# Patient Record
Sex: Female | Born: 1955
Health system: Southern US, Community
[De-identification: ages and names within clinical notes are randomized; demographics above are authoritative.]

## PROBLEM LIST (undated history)

## (undated) DIAGNOSIS — Z8742 Personal history of other diseases of the female genital tract: Secondary | ICD-10-CM

## (undated) DIAGNOSIS — N393 Stress incontinence (female) (male): Secondary | ICD-10-CM

## (undated) DIAGNOSIS — IMO0002 Reserved for concepts with insufficient information to code with codable children: Secondary | ICD-10-CM

## (undated) DIAGNOSIS — I10 Essential (primary) hypertension: Secondary | ICD-10-CM

## (undated) DIAGNOSIS — R9431 Abnormal electrocardiogram [ECG] [EKG]: Secondary | ICD-10-CM

## (undated) DIAGNOSIS — N950 Postmenopausal bleeding: Secondary | ICD-10-CM

## (undated) DIAGNOSIS — N840 Polyp of corpus uteri: Secondary | ICD-10-CM

## (undated) DIAGNOSIS — R32 Unspecified urinary incontinence: Secondary | ICD-10-CM

## (undated) DIAGNOSIS — G4730 Sleep apnea, unspecified: Secondary | ICD-10-CM

## (undated) DIAGNOSIS — Z8719 Personal history of other diseases of the digestive system: Secondary | ICD-10-CM

## (undated) DIAGNOSIS — B379 Candidiasis, unspecified: Secondary | ICD-10-CM

## (undated) DIAGNOSIS — M545 Low back pain, unspecified: Secondary | ICD-10-CM

## (undated) DIAGNOSIS — M8580 Other specified disorders of bone density and structure, unspecified site: Secondary | ICD-10-CM

## (undated) DIAGNOSIS — D219 Benign neoplasm of connective and other soft tissue, unspecified: Secondary | ICD-10-CM

## (undated) DIAGNOSIS — E785 Hyperlipidemia, unspecified: Secondary | ICD-10-CM

## (undated) HISTORY — DX: Essential (primary) hypertension: I10

## (undated) HISTORY — DX: Hyperlipidemia, unspecified: E78.5

## (undated) HISTORY — DX: Postmenopausal bleeding: N95.0

## (undated) HISTORY — DX: Unspecified urinary incontinence: R32

## (undated) HISTORY — DX: Personal history of other diseases of the female genital tract: Z87.42

## (undated) HISTORY — DX: Stress incontinence (female) (male): N39.3

## (undated) HISTORY — DX: Benign neoplasm of connective and other soft tissue, unspecified: D21.9

## (undated) HISTORY — DX: Polyp of corpus uteri: N84.0

## (undated) HISTORY — DX: Sleep apnea, unspecified: G47.30

## (undated) HISTORY — DX: Personal history of other diseases of the digestive system: Z87.19

## (undated) HISTORY — DX: Reserved for concepts with insufficient information to code with codable children: IMO0002

## (undated) HISTORY — DX: Candidiasis, unspecified: B37.9

## (undated) HISTORY — DX: Abnormal electrocardiogram (ECG) (EKG): R94.31

## (undated) HISTORY — DX: Other specified disorders of bone density and structure, unspecified site: M85.80

---

## 1977-06-21 HISTORY — PX: OVARIAN CYST REMOVAL: SHX89

## 1979-06-22 DIAGNOSIS — IMO0002 Reserved for concepts with insufficient information to code with codable children: Secondary | ICD-10-CM

## 1979-06-22 HISTORY — DX: Reserved for concepts with insufficient information to code with codable children: IMO0002

## 1984-06-21 DIAGNOSIS — Z8719 Personal history of other diseases of the digestive system: Secondary | ICD-10-CM

## 1984-06-21 HISTORY — DX: Personal history of other diseases of the digestive system: Z87.19

## 1993-06-21 HISTORY — PX: OTHER SURGICAL HISTORY: SHX169

## 1995-06-22 DIAGNOSIS — B379 Candidiasis, unspecified: Secondary | ICD-10-CM

## 1995-06-22 HISTORY — DX: Candidiasis, unspecified: B37.9

## 1996-06-21 DIAGNOSIS — R32 Unspecified urinary incontinence: Secondary | ICD-10-CM

## 1996-06-21 HISTORY — DX: Unspecified urinary incontinence: R32

## 1998-06-21 HISTORY — PX: FOOT NEUROMA SURGERY: SHX646

## 1999-01-09 ENCOUNTER — Ambulatory Visit (HOSPITAL_COMMUNITY): Admission: RE | Admit: 1999-01-09 | Discharge: 1999-01-09 | Payer: Self-pay | Admitting: Internal Medicine

## 1999-01-09 ENCOUNTER — Encounter (INDEPENDENT_AMBULATORY_CARE_PROVIDER_SITE_OTHER): Payer: Self-pay

## 1999-01-09 ENCOUNTER — Encounter: Payer: Self-pay | Admitting: Internal Medicine

## 1999-07-14 ENCOUNTER — Ambulatory Visit (HOSPITAL_COMMUNITY): Admission: RE | Admit: 1999-07-14 | Discharge: 1999-07-14 | Payer: Self-pay | Admitting: Internal Medicine

## 1999-07-14 ENCOUNTER — Encounter: Payer: Self-pay | Admitting: Internal Medicine

## 2000-01-04 ENCOUNTER — Encounter: Admission: RE | Admit: 2000-01-04 | Discharge: 2000-01-04 | Payer: Self-pay | Admitting: Internal Medicine

## 2000-01-04 ENCOUNTER — Encounter: Payer: Self-pay | Admitting: Internal Medicine

## 2000-10-23 ENCOUNTER — Ambulatory Visit (HOSPITAL_BASED_OUTPATIENT_CLINIC_OR_DEPARTMENT_OTHER): Admission: RE | Admit: 2000-10-23 | Discharge: 2000-10-23 | Payer: Self-pay | Admitting: Family Medicine

## 2001-01-18 ENCOUNTER — Encounter: Payer: Self-pay | Admitting: Family Medicine

## 2001-01-18 ENCOUNTER — Encounter: Admission: RE | Admit: 2001-01-18 | Discharge: 2001-01-18 | Payer: Self-pay | Admitting: Family Medicine

## 2001-06-20 ENCOUNTER — Other Ambulatory Visit: Admission: RE | Admit: 2001-06-20 | Discharge: 2001-06-20 | Payer: Self-pay | Admitting: Obstetrics and Gynecology

## 2001-06-21 DIAGNOSIS — Z8742 Personal history of other diseases of the female genital tract: Secondary | ICD-10-CM

## 2001-06-21 DIAGNOSIS — N393 Stress incontinence (female) (male): Secondary | ICD-10-CM

## 2001-06-21 HISTORY — DX: Personal history of other diseases of the female genital tract: Z87.42

## 2001-06-21 HISTORY — DX: Stress incontinence (female) (male): N39.3

## 2001-06-30 ENCOUNTER — Encounter: Payer: Self-pay | Admitting: Obstetrics and Gynecology

## 2001-06-30 ENCOUNTER — Ambulatory Visit (HOSPITAL_COMMUNITY): Admission: RE | Admit: 2001-06-30 | Discharge: 2001-06-30 | Payer: Self-pay | Admitting: Obstetrics and Gynecology

## 2002-01-22 ENCOUNTER — Encounter: Admission: RE | Admit: 2002-01-22 | Discharge: 2002-01-22 | Payer: Self-pay | Admitting: Obstetrics and Gynecology

## 2002-01-22 ENCOUNTER — Encounter: Payer: Self-pay | Admitting: Obstetrics and Gynecology

## 2002-06-27 ENCOUNTER — Other Ambulatory Visit: Admission: RE | Admit: 2002-06-27 | Discharge: 2002-06-27 | Payer: Self-pay | Admitting: Obstetrics and Gynecology

## 2003-01-24 ENCOUNTER — Encounter: Payer: Self-pay | Admitting: Obstetrics and Gynecology

## 2003-01-24 ENCOUNTER — Encounter: Admission: RE | Admit: 2003-01-24 | Discharge: 2003-01-24 | Payer: Self-pay | Admitting: Obstetrics and Gynecology

## 2003-07-17 ENCOUNTER — Other Ambulatory Visit: Admission: RE | Admit: 2003-07-17 | Discharge: 2003-07-17 | Payer: Self-pay | Admitting: Obstetrics and Gynecology

## 2003-09-09 ENCOUNTER — Ambulatory Visit (HOSPITAL_COMMUNITY): Admission: RE | Admit: 2003-09-09 | Discharge: 2003-09-09 | Payer: Self-pay | Admitting: Obstetrics and Gynecology

## 2004-02-14 ENCOUNTER — Ambulatory Visit (HOSPITAL_COMMUNITY): Admission: RE | Admit: 2004-02-14 | Discharge: 2004-02-14 | Payer: Self-pay | Admitting: Obstetrics and Gynecology

## 2004-08-05 ENCOUNTER — Other Ambulatory Visit: Admission: RE | Admit: 2004-08-05 | Discharge: 2004-08-05 | Payer: Self-pay | Admitting: Obstetrics and Gynecology

## 2005-02-15 ENCOUNTER — Ambulatory Visit (HOSPITAL_COMMUNITY): Admission: RE | Admit: 2005-02-15 | Discharge: 2005-02-15 | Payer: Self-pay | Admitting: Obstetrics and Gynecology

## 2005-06-24 ENCOUNTER — Encounter: Admission: RE | Admit: 2005-06-24 | Discharge: 2005-06-24 | Payer: Self-pay | Admitting: Family Medicine

## 2005-08-10 ENCOUNTER — Other Ambulatory Visit: Admission: RE | Admit: 2005-08-10 | Discharge: 2005-08-10 | Payer: Self-pay | Admitting: Obstetrics and Gynecology

## 2006-02-16 ENCOUNTER — Ambulatory Visit (HOSPITAL_COMMUNITY): Admission: RE | Admit: 2006-02-16 | Discharge: 2006-02-16 | Payer: Self-pay | Admitting: Obstetrics and Gynecology

## 2006-08-19 ENCOUNTER — Encounter: Admission: RE | Admit: 2006-08-19 | Discharge: 2006-08-19 | Payer: Self-pay | Admitting: Family Medicine

## 2006-09-07 ENCOUNTER — Encounter: Admission: RE | Admit: 2006-09-07 | Discharge: 2006-12-06 | Payer: Self-pay | Admitting: Family Medicine

## 2006-12-30 ENCOUNTER — Ambulatory Visit (HOSPITAL_COMMUNITY): Admission: RE | Admit: 2006-12-30 | Discharge: 2006-12-30 | Payer: Self-pay | Admitting: Gastroenterology

## 2006-12-30 ENCOUNTER — Encounter (INDEPENDENT_AMBULATORY_CARE_PROVIDER_SITE_OTHER): Payer: Self-pay | Admitting: Gastroenterology

## 2007-01-23 ENCOUNTER — Encounter: Admission: RE | Admit: 2007-01-23 | Discharge: 2007-01-23 | Payer: Self-pay | Admitting: Family Medicine

## 2007-01-24 ENCOUNTER — Ambulatory Visit (HOSPITAL_COMMUNITY): Admission: RE | Admit: 2007-01-24 | Discharge: 2007-01-24 | Payer: Self-pay | Admitting: Gastroenterology

## 2007-01-27 ENCOUNTER — Ambulatory Visit (HOSPITAL_COMMUNITY): Admission: RE | Admit: 2007-01-27 | Discharge: 2007-01-27 | Payer: Self-pay | Admitting: Gastroenterology

## 2007-02-21 ENCOUNTER — Ambulatory Visit (HOSPITAL_COMMUNITY): Admission: RE | Admit: 2007-02-21 | Discharge: 2007-02-21 | Payer: Self-pay | Admitting: Obstetrics and Gynecology

## 2007-06-22 DIAGNOSIS — N840 Polyp of corpus uteri: Secondary | ICD-10-CM

## 2007-06-22 DIAGNOSIS — N950 Postmenopausal bleeding: Secondary | ICD-10-CM

## 2007-06-22 HISTORY — PX: COMBINED HYSTEROSCOPY DIAGNOSTIC / D&C: SUR297

## 2007-06-22 HISTORY — DX: Postmenopausal bleeding: N95.0

## 2007-06-22 HISTORY — DX: Polyp of corpus uteri: N84.0

## 2007-07-25 ENCOUNTER — Encounter: Admission: RE | Admit: 2007-07-25 | Discharge: 2007-07-25 | Payer: Self-pay | Admitting: Obstetrics and Gynecology

## 2007-08-31 ENCOUNTER — Encounter (INDEPENDENT_AMBULATORY_CARE_PROVIDER_SITE_OTHER): Payer: Self-pay | Admitting: Obstetrics and Gynecology

## 2007-08-31 ENCOUNTER — Ambulatory Visit (HOSPITAL_COMMUNITY): Admission: RE | Admit: 2007-08-31 | Discharge: 2007-08-31 | Payer: Self-pay | Admitting: Obstetrics and Gynecology

## 2008-02-22 ENCOUNTER — Ambulatory Visit (HOSPITAL_COMMUNITY): Admission: RE | Admit: 2008-02-22 | Discharge: 2008-02-22 | Payer: Self-pay | Admitting: Obstetrics and Gynecology

## 2008-05-21 ENCOUNTER — Encounter: Admission: RE | Admit: 2008-05-21 | Discharge: 2008-05-21 | Payer: Self-pay | Admitting: Family Medicine

## 2009-03-03 ENCOUNTER — Ambulatory Visit (HOSPITAL_COMMUNITY): Admission: RE | Admit: 2009-03-03 | Discharge: 2009-03-03 | Payer: Self-pay | Admitting: Obstetrics and Gynecology

## 2010-03-23 ENCOUNTER — Ambulatory Visit (HOSPITAL_COMMUNITY): Admission: RE | Admit: 2010-03-23 | Discharge: 2010-03-23 | Payer: Self-pay | Admitting: Obstetrics and Gynecology

## 2010-07-12 ENCOUNTER — Encounter: Payer: Self-pay | Admitting: Family Medicine

## 2010-11-03 NOTE — H&P (Signed)
NAME:  TAWANDA, SCHALL           ACCOUNT NO.:  [id number]   MEDICAL RECORD NO.:  [id number]          PATIENT TYPE:  AMB   LOCATION:  SDC                           FACILITY:  [facility]   PHYSICIAN:  Hal Morales, M.D.DATE OF BIRTH:  [birth year]   DATE OF ADMISSION:  DATE OF DISCHARGE:                              HISTORY & PHYSICAL   HISTORY OF PRESENT ILLNESS:  The patient is a 55 year old black married  female para 2-0-0-2 who presents for evaluation and management of  postmenopausal bleeding.  The patient underwent menopause in 2006 and  had absolutely no vaginal bleeding since that time until February 2009  at which time she had bleeding that she noted with wiping.  She had a  Pap smear undertaken at that time which was   DICTATION ENDED AT THIS POINT      Hal Morales, M.D.  Electronically Signed     VPH/MEDQ  D:  08/30/2007  T:  08/30/2007  Job:  [id number]

## 2010-11-03 NOTE — H&P (Signed)
[name], [name]           ACCOUNT NO.:  [id number]   MEDICAL RECORD NO.:  [id number]          PATIENT TYPE:  AMB   LOCATION:  SDC                           FACILITY:  [facility]   PHYSICIAN:  Hal Morales, M.D.DATE OF BIRTH:  1956-03-13   DATE OF ADMISSION:  08/31/2007  DATE OF DISCHARGE:                              HISTORY & PHYSICAL   HISTORY OF PRESENT ILLNESS:  The patient is a 55 year old black married  female para 2-0-0-2 who presents for evaluation and management of  postmenopausal bleeding.  The patient underwent menopause in 2006 and  had no further menstrual bleeding until February 2009 at which time she  began to note vaginal bleeding with wiping.  She was evaluated with a  Pap smear which was normal and a sonohysterogram which showed uterine  fibroids with the largest measuring 4.46 cm and none were submucosal.  There were two hyperechoic masses consistent with polyps, the largest of  which was 1.8 cm.  She presents for hysteroscopy, D&C and polyp removal.   PAST MEDICAL HISTORY:   MEDICAL ILLNESSES:  The patient was diagnosed with ulcerative colitis in  1986 but has required no therapy since 1990.  She was more recently  diagnosed with hypertension and diabetes which are managed in  conjunction with Dr. Renaye Rakers and said to be well-controlled.   GYN HISTORY:  The patient had a left ovarian cystectomy for removal of a  teratoma in 1995.  She had an abnormal Pap smear 1981 which was normal  on repeat and has been normal since that time.   OBSTETRICAL HISTORY:  She had spontaneous vaginal deliveries in 1986 and  1990 without difficulty.   FAMILY HISTORY:  Is positive for diabetes, hypertension and ovarian  cancer.   CURRENT MEDICATIONS:  Crestor, Diovan,  Januvia, hydrocodone p.r.n.  muscle spasms, calcium with vitamin D and multivitamin.   DRUG ALLERGIES:  SULFA, BEES WAX, ASPIRIN, LIALDA.   REVIEW OF SYSTEMS:  Is negative except as mentioned above.   The patient  specifically denies any abdominal pain, nausea, vomiting, constipation,  diarrhea or rectal bleeding.   PHYSICAL EXAMINATION:  The patient is an obese black female in no acute  distress, temperature 98.6, pulse 56, blood pressure is 130/86.  Weight  is 224 pounds.  Height is 5 feet 6 inches.  LUNGS: Clear.  HEART: Regular rate and rhythm.  ABDOMEN is obese without masses or organomegaly.  PELVIC:  EG/BUS within normal limits.  The vagina is atrophic.  The  cervix is nontender with no lesions.  The uterus is 8-10-week size and  irregular, but nontender.  Adnexa no masses.  Rectovaginal no masses.   IMPRESSION:  1. Postmenopausal bleeding.  2. Probable endometrial polyp.  3. Chronic hypertension.  4. Diabetes mellitus.  5. Obesity.  6. Stable ulcerative colitis   DISPOSITION:  Recommendation was made for hysteroscopy, D&C and removal  of polyp.  This will be performed on August 31, 2007 at [facility].  The risks of anesthesia, bleeding, infection, damage to adjacent organs  and uterine perforation have been explained to the patient, she seems to  understand and wishes to proceed.      Hal Morales, M.D.  Electronically Signed     VPH/MEDQ  D:  08/30/2007  T:  08/30/2007  Job:  [id number]

## 2010-11-03 NOTE — Op Note (Signed)
NAMECURTINA, Amber Adams           ACCOUNT NO.:  [id number]   MEDICAL RECORD NO.:  [id number]          PATIENT TYPE:  AMB   LOCATION:  ENDO                         FACILITY:  [facility]   PHYSICIAN:  Anselmo Rod, M.D.  DATE OF BIRTH:  1956-04-26   DATE OF PROCEDURE:  12/30/2006  DATE OF DISCHARGE:                               OPERATIVE REPORT   PROCEDURE PERFORMED:  Esophagogastroduodenoscopy with small bowel  biopsies.   ENDOSCOPIST:  Anselmo Rod, M.D.   INSTRUMENT USED:  Pentax video panendoscope.   INDICATIONS FOR PROCEDURE:  A 55 year old [place] American female  undergoing EGD for dysphagia and reflux, to rule out esophagitis,  gastritis, stricture, etc.   PREPROCEDURE PREPARATION:  Informed consent was procured from the  patient. The patient had fasted for 8 hours prior to the procedure. The  risks and benefits of the procedure were discussed with the patient in  great detail.   PREPROCEDURE PHYSICAL:  The patient had stable vital signs.  NECK:  Supple.  CHEST:  Clear to auscultation.  S1, S2 regular.  ABDOMEN:  Soft with normal bowel sounds.   DESCRIPTION OF PROCEDURE:  The patient was placed in the left lateral  decubitus position and sedated with 75 mcg of Fentanyl and 7.5 mg of  Versed given intravenously in slow incremental doses. Once the patient  was adequately sedate and maintained on low-flow oxygen and continuous  cardiac monitoring, the Pentax video panendoscope was advanced through  the mouthpiece, over the tongue, into the esophagus under direct vision.  The entire esophagus was widely patent with no evidence of ring,  stricture, masses, esophagitis or Barrett's mucosa. The scope was then  advanced into the stomach. Mild antral gastritis was noted. No masses or  polyps were seen. There was no evidence of ulcer disease.  Retroflexion  in the high cardia revealed no abnormalities.  There was no evidence of  a hiatal hernia.  The proximal small bowel  seemed somewhat abnormal in  the fact that the folds were somewhat scalloped; biopsies were done to  rule out presence of sprue, and there ws no obstruction. The patient  tolerated the procedure well without immediate complications.   IMPRESSION:  1. Normal-appearing esophagus. No stricture or Barrett's mucosa noted.  2. Mild antral gastritis; otherwise normal-appearing stomach.  3. Scalloped folds in the proximal small bowel, biopsies done to rule      out sprue.   RECOMMENDATIONS:  1. Await pathology results.  2. Avoid all nonsteroidals including aspirin for the next 4 weeks.  3. Proceed with a colonoscopy at this time.  4. Further recommendation to be made in follow-up.      Anselmo Rod, M.D.  Electronically Signed     JNM/MEDQ  D:  12/30/2006  T:  12/31/2006  Job:  [id number]   cc:   Hal Morales, M.D.  Fax: [phone]   Renaye Rakers, M.D.  Fax: [phone]

## 2010-11-03 NOTE — Op Note (Signed)
NAMEMANALI, MCELMURRY           ACCOUNT NO.:  [id number]   MEDICAL RECORD NO.:  [id number]          PATIENT TYPE:  AMB   LOCATION:  SDC                           [facility]ACILITY:  [facility]   PHYSICIAN:  Hal Morales, M.D.DATE OF BIRTH:  [birth year]   DATE OF PROCEDURE:  08/31/2007  DATE OF DISCHARGE:                               OPERATIVE REPORT   PREOPERATIVE DIAGNOSIS:  Post menopausal bleeding, endometrial polyps.   POSTOPERATIVE DIAGNOSES:  Post menopausal bleeding, endometrial polyps.   OPERATION:  Hysteroscopy, dilatation and curettage, polyp resection.   SURGEON:  Hal Morales, M.D.   ANESTHESIA:  General.   ESTIMATED BLOOD LOSS:  Less than 10 mL.   COMPLICATIONS:  None.   FINDINGS:  The uterine cavity contained two polypoid lesions, one on the  anterior fundal portion of the uterus with a broad flat base, the second  on the right lateral sidewall measuring approximately 3 mm and more  pedunculated.   PROCEDURE:  The patient was taken to the operating room after  appropriate identification and placed on the operating table.  After the  attainment of adequate general anesthesia, she was placed in the  lithotomy position.  The perineum and vagina were prepped with multiple  layers of Betadine and a red Robinson catheter used to empty the  bladder.  The perineum was draped as a sterile field.  A Graves speculum  was placed in the vagina and a paracervical block achieved with a total  of 10 mL of 2% Xylocaine in the 5 and 7 o'clock positions.  The cervix  was grasped with a tenaculum and sounded to 8 cm.  The cervix was then  dilated to accommodate the diagnostic hysteroscope which was used to  observe and document the above noted findings.  The resectoscope was  then used to sharply resect the anterior polypoid lesion as well as the  lateral polypoid lesion.  These were removed from the operative field  and the endometrial cavity was curetted in all four quadrants.  Specimens to pathology were endometrial polyps and endometrial  curettings.  All instruments were then removed from the vagina and the  patient was awakened from general anesthesia and taken to the recovery  room in satisfactory condition having tolerated the procedure well with  sponge and instrument counts correct.  The fluid deficit during the  entire procedure was 430 mL, though some of it was noted to be on the  floor and not captured by the automated fluid counter.   Discharge instructions are printed instructions for [facility] from [facility].  Discharge medications are ibuprofen 600 mg p.o. q.6h. p.r.n.  pain using over-the-counter medications.  The patient will have a follow  up appointment in two weeks.      Hal Morales, M.D.  Electronically Signed     VPH/MEDQ  D:  08/31/2007  T:  09/01/2007  Job:  [id number]

## 2010-11-03 NOTE — Op Note (Signed)
Amber Adams, Amber Adams           ACCOUNT NO.:  [id number]   MEDICAL RECORD NO.:  [id number]          PATIENT TYPE:  AMB   LOCATION:  ENDO                         FACILITY:  [facility]   PHYSICIAN:  Anselmo Rod, M.D.  DATE OF BIRTH:  Apr 17, 1956   DATE OF PROCEDURE:  12/30/2006  DATE OF DISCHARGE:                               OPERATIVE REPORT   PROCEDURE PERFORMED:  Colonoscopy with snare polypectomy x 6 and  multiple cold biopsies.   ENDOSCOPIST:  Anselmo Rod, M.D.   INSTRUMENT USED:  Pentax video colonoscope.   INDICATIONS FOR PROCEDURE:  A 55 year old [place] American female with a  questionable history of UC, undergoing a colonoscopy to rule out colonic  polyps, masses, etc.  Biopsies will be done for surveillance if needed.   PREPROCEDURE PREPARATION:  Informed consent was procured from the  patient. The patient was fasted for 4 hours prior to the procedure and  prepped with MoviPrep the night of and the morning of the procedure.  Risks and benefits of the procedure including a 10% miss rate of cancer  and polyp were discussed with the patient as well.   PREPROCEDURE PHYSICAL:  The patient had stable vital signs.  NECK:  Supple.  CHEST:  The to auscultation.  S1, S2 regular.  ABDOMEN:  Soft with normal bowel sounds.   DESCRIPTION OF PROCEDURE:  The patient was placed in left lateral  decubitus position and sedated with an additional 45 mcg of Fentanyl and  4.5 mg of Versed given intravenously in slow incremental doses. Once the  patient was adequately sedate and maintained on low-flow oxygen and  continuous cardiac monitoring, the Pentax video colonoscope was advanced  from the rectum to the cecum.  The appendiceal orifice and ileocecal  valve were visualized after multiple washes. The terminal ileum appeared  healthy and without lesions. Patchy ulceration was noted in the cecum,  left colon and right colon. Multiple biopsies were done from these  areas. Six small  sessile polyps were removed via hot snare from 30 to 40  cm. Small internal hemorrhoids were seen on retroflexion. There was no  evidence of diverticulosis. The patient tolerated the procedure well  without immediate complications.   IMPRESSION:  1. Six small sessile polyps removed from the rectosigmoid colon (30-40      cm) by hot snare.  2. Patchy ulceration in the cecum, left colon and right colon.      Multiple biopsies done.  3. Normal terminal ileum.  4. No evidence of diverticulosis.   RECOMMENDATIONS:  1. Await pathology results.  2. Avoid all nonsteroidals including Aspirin for the next 4 weeks.  3. Repeat colonoscopy depending on pathology results.  4. Outpatient follow-up in the next 2 weeks for further      recommendations.      Anselmo Rod, M.D.  Electronically Signed     JNM/MEDQ  D:  12/30/2006  T:  12/31/2006  Job:  [id number]   cc:   Hal Morales, M.D.  Fax: [phone]   Renaye Rakers, M.D.  Fax: [phone]

## 2011-01-06 DIAGNOSIS — G4730 Sleep apnea, unspecified: Secondary | ICD-10-CM

## 2011-01-06 HISTORY — DX: Sleep apnea, unspecified: G47.30

## 2011-03-09 ENCOUNTER — Other Ambulatory Visit (HOSPITAL_COMMUNITY): Payer: Self-pay | Admitting: Obstetrics and Gynecology

## 2011-03-09 DIAGNOSIS — Z1231 Encounter for screening mammogram for malignant neoplasm of breast: Secondary | ICD-10-CM

## 2011-03-15 LAB — BASIC METABOLIC PANEL
BUN: 14
Calcium: 10.1
Chloride: 104
Creatinine, Ser: 1.03
GFR calc Af Amer: >60

## 2011-03-15 LAB — CBC
MCHC: 34.8
MCV: 91.2
Platelets: 254
WBC: 9.2

## 2011-03-25 ENCOUNTER — Ambulatory Visit (HOSPITAL_COMMUNITY)
Admission: RE | Admit: 2011-03-25 | Discharge: 2011-03-25 | Disposition: A | Discharge status: Home / Self Care | Payer: Managed Care, Other (non HMO) | Source: Ambulatory Visit | Attending: Obstetrics and Gynecology | Admitting: Obstetrics and Gynecology

## 2011-03-25 DIAGNOSIS — Z1231 Encounter for screening mammogram for malignant neoplasm of breast: Secondary | ICD-10-CM | POA: Insufficient documentation

## 2011-03-30 ENCOUNTER — Other Ambulatory Visit: Payer: Self-pay | Admitting: Obstetrics and Gynecology

## 2011-03-30 ENCOUNTER — Other Ambulatory Visit: Payer: Self-pay | Admitting: Family Medicine

## 2011-03-30 ENCOUNTER — Ambulatory Visit
Admission: RE | Admit: 2011-03-30 | Discharge: 2011-03-30 | Disposition: A | Discharge status: Home / Self Care | Payer: Managed Care, Other (non HMO) | Source: Ambulatory Visit | Attending: Family Medicine | Admitting: Family Medicine

## 2011-03-30 DIAGNOSIS — M1250 Traumatic arthropathy, unspecified site: Secondary | ICD-10-CM

## 2011-03-30 DIAGNOSIS — R928 Other abnormal and inconclusive findings on diagnostic imaging of breast: Secondary | ICD-10-CM

## 2011-04-05 ENCOUNTER — Ambulatory Visit
Admission: RE | Admit: 2011-04-05 | Discharge: 2011-04-05 | Disposition: A | Discharge status: Home / Self Care | Payer: Managed Care, Other (non HMO) | Source: Ambulatory Visit | Attending: Obstetrics and Gynecology | Admitting: Obstetrics and Gynecology

## 2011-04-05 DIAGNOSIS — R928 Other abnormal and inconclusive findings on diagnostic imaging of breast: Secondary | ICD-10-CM

## 2012-02-02 ENCOUNTER — Ambulatory Visit (INDEPENDENT_AMBULATORY_CARE_PROVIDER_SITE_OTHER): Payer: Private Health Insurance - Indemnity | Admitting: Obstetrics and Gynecology

## 2012-02-02 ENCOUNTER — Encounter: Payer: Self-pay | Admitting: Obstetrics and Gynecology

## 2012-02-02 VITALS — BP 120/68 | Ht 66.0 in | Wt 241.0 lb

## 2012-02-02 DIAGNOSIS — D649 Anemia, unspecified: Secondary | ICD-10-CM

## 2012-02-02 DIAGNOSIS — D219 Benign neoplasm of connective and other soft tissue, unspecified: Secondary | ICD-10-CM

## 2012-02-02 DIAGNOSIS — D259 Leiomyoma of uterus, unspecified: Secondary | ICD-10-CM

## 2012-02-02 DIAGNOSIS — N840 Polyp of corpus uteri: Secondary | ICD-10-CM | POA: Insufficient documentation

## 2012-02-02 DIAGNOSIS — Z8719 Personal history of other diseases of the digestive system: Secondary | ICD-10-CM | POA: Insufficient documentation

## 2012-02-02 DIAGNOSIS — R32 Unspecified urinary incontinence: Secondary | ICD-10-CM | POA: Insufficient documentation

## 2012-02-02 DIAGNOSIS — N393 Stress incontinence (female) (male): Secondary | ICD-10-CM | POA: Insufficient documentation

## 2012-02-02 DIAGNOSIS — Z8742 Personal history of other diseases of the female genital tract: Secondary | ICD-10-CM

## 2012-02-02 DIAGNOSIS — R6889 Other general symptoms and signs: Secondary | ICD-10-CM

## 2012-02-02 DIAGNOSIS — IMO0002 Reserved for concepts with insufficient information to code with codable children: Secondary | ICD-10-CM

## 2012-02-02 DIAGNOSIS — N950 Postmenopausal bleeding: Secondary | ICD-10-CM | POA: Insufficient documentation

## 2012-02-02 DIAGNOSIS — I10 Essential (primary) hypertension: Secondary | ICD-10-CM | POA: Insufficient documentation

## 2012-02-02 DIAGNOSIS — B49 Unspecified mycosis: Secondary | ICD-10-CM

## 2012-02-02 DIAGNOSIS — B379 Candidiasis, unspecified: Secondary | ICD-10-CM

## 2012-02-02 NOTE — Progress Notes (Signed)
Last Pap: 01/06/2011 WNL: Yes Regular Periods:no Contraception: POST-MENOPAUSAL  Monthly Breast exam:yes Tetanus<29yrs: UNSURE Nl.Bladder Function:yes Daily BMs:yes Healthy Diet:yes Calcium:no Mammogram:yes Date of Mammogram: 04/05/2011 Exercise:yes Have often Exercise: 2-3 TIMES WEEKLY Seatbelt: yes Abuse at home: no Stressful work:no Sigmoid-colonoscopy: 5 YEARS AGO Bone Density: Yes 12/03/2009 PCP: DR. Parke Simmers Change in PMH: NONE Change in [facility]: NONE  Subjective:    Amber Adams is a 56 y.o. female, G2P2002, who presents for an annual exam.     History   Social History  . Marital Status: Married    Spouse Name: N/A    Number of Children: N/A  . Years of Education: N/A   Social History Main Topics  . Smoking status: Never Smoker   . Smokeless tobacco: Never Used  . Alcohol Use: No  . Drug Use: No  . Sexually Active: Yes    Birth Control/ Protection: Post-menopausal   Other Topics Concern  . None   Social History Narrative  . None    Menstrual cycle:   LMP: No LMP recorded. Patient is postmenopausal.           Cycle: none menopausal  The following portions of the patient's history were reviewed and updated as appropriate: allergies, current medications, past family history, past medical history, past social history, past surgical history and problem list.  Review of Systems Pertinent items are noted in HPI. Breast:Negative for breast lump,nipple discharge or nipple retraction Gastrointestinal: Negative for abdominal pain, change in bowel habits or rectal bleeding Urinary:negative   Objective:    BP 120/68  Ht 5\' 6"  (1.676 m)  Wt 241 lb (109.317 kg)  BMI 38.90 kg/m2    Weight:  Wt Readings from Last 1 Encounters:  02/02/12 241 lb (109.317 kg)          BMI: Body mass index is 38.90 kg/(m^2).  General Appearance: Alert, appropriate appearance for age. No acute distress HEENT: Grossly normal Neck / Thyroid: Supple, no masses, nodes or  enlargement Lungs: clear to auscultation bilaterally Back: No CVA tenderness Breast Exam: No masses or nodes.No dimpling, nipple retraction or discharge. Cardiovascular: Regular rate and rhythm. S1, S2, no murmur Gastrointestinal: Soft, non-tender, no masses or organomegaly Pelvic Exam: External genitalia: normal general appearance Vaginal: normal mucosa without prolapse or lesions Cervix: normal appearance Adnexa: non palpable Uterus: does not feel enlarged however examination is compromised by patient habitus Exam limited by body habitus Rectovaginal: normal rectal, no masses Lymphatic Exam: Non-palpable nodes in neck, clavicular, axillary, or inguinal regions Skin: no rash or abnormalities Neurologic: Normal gait and speech, no tremor  Psychiatric: Alert and oriented, appropriate affect.   Wet Prep:not applicable Urinalysis:not applicable UPT: Not done   Assessment:    Menopause    Plan:    mammogram pap smear return annually or prn        Kristia Jupiter PMD

## 2012-03-17 ENCOUNTER — Other Ambulatory Visit: Payer: Self-pay | Admitting: Obstetrics and Gynecology

## 2012-03-17 DIAGNOSIS — Z1231 Encounter for screening mammogram for malignant neoplasm of breast: Secondary | ICD-10-CM

## 2012-03-31 ENCOUNTER — Ambulatory Visit (HOSPITAL_COMMUNITY)
Admission: RE | Admit: 2012-03-31 | Discharge: 2012-03-31 | Disposition: A | Discharge status: Home / Self Care | Payer: Managed Care, Other (non HMO) | Source: Ambulatory Visit | Attending: Obstetrics and Gynecology | Admitting: Obstetrics and Gynecology

## 2012-03-31 DIAGNOSIS — Z1231 Encounter for screening mammogram for malignant neoplasm of breast: Secondary | ICD-10-CM | POA: Insufficient documentation

## 2012-07-12 ENCOUNTER — Telehealth: Payer: Self-pay | Admitting: Obstetrics and Gynecology

## 2012-07-12 NOTE — Telephone Encounter (Signed)
[facility] pt

## 2012-07-12 NOTE — Telephone Encounter (Signed)
Tc to pt per telephone call. Pt with vag spotting this am with wiping. Pt has changed approx 2 pantyliners today. No fever. Pt with a "feeling in my stomach like I am going to start my cycle". Appt sched 07/14/12 2 10:30a with [facility] for eval. Informed pt to cb if bldg inc, abd pain, or fever occurs. Pt agrees.

## 2012-07-14 ENCOUNTER — Ambulatory Visit: Payer: Private Health Insurance - Indemnity | Admitting: Obstetrics and Gynecology

## 2012-07-14 ENCOUNTER — Encounter: Payer: Self-pay | Admitting: Obstetrics and Gynecology

## 2012-07-14 VITALS — BP 110/66 | Ht 66.0 in | Wt 237.0 lb

## 2012-07-14 DIAGNOSIS — N950 Postmenopausal bleeding: Secondary | ICD-10-CM

## 2012-07-14 DIAGNOSIS — D219 Benign neoplasm of connective and other soft tissue, unspecified: Secondary | ICD-10-CM

## 2012-07-14 LAB — POCT URINALYSIS DIPSTICK
Glucose, UA: NEGATIVE
Nitrite, UA: NEGATIVE
Protein, UA: NEGATIVE
Urobilinogen, UA: NEGATIVE

## 2012-07-14 NOTE — Progress Notes (Signed)
Subjective:     Amber Adams is a 57 y.o. woman,G2P2002, who presents Post Menopausal Bleeding. She has had no bleeding since 2009 when she had a hysteroscopy for removal of endometrial polyps.  She has a hx of fibroids and a family hx of uterine sarcoma in her mother, and ovarian cancer in her maternal grandmother. Bleeding pattern: Pt states woke up 2 days and went to void and noticed bright red bleeding. She has continued bleeding like menses since then.  When did bleeding start: 07/12/2012 How  Long: 2 days  How often changing pad/tampon: 3-4 times daily Bleeding Disorders: no Cramping: no Contraception: no Fibroids: yes Hormone Therapy: no New Medications: yes Garcinia  Menopausal Symptoms: no Vag. Discharge: no Abdominal Pain: no Increased Stress: no Denies any urinary tract symptoms, changes in bowel movements, nausea, vomiting or fever.   Current contraception: post menopausal status.  The following portions of the patient's history were reviewed and updated as appropriate: allergies, current medications, past family history, past medical history, past social history and past surgical history. She has been followed by her orthopedist for back pain and has received 3 steroid injections in the last few months, most recently 06/27/12  Review of Systems Pertinent items are noted in HPI.    Objective:    BP 110/66  Ht 5\' 6"  (1.676 m)  Wt 237 lb (107.502 kg)  BMI 38.25 kg/m2  LMP 07/12/2012  Weight:  Wt Readings from Last 1 Encounters:  07/14/12 237 lb (107.502 kg)    BMI: Body mass index is 38.25 kg/(m^2).  General Appearance: Alert, appropriate appearance for age. No acute distress HEENT: Grossly normal Neck / Thyroid: Supple, no masses, nodes or enlargement Lungs: clear to auscultation bilaterally Back: No CVA tenderness Gastrointestinal: Soft, non-tender, no masses or organomegaly Pelvic Exam: External genitalia: normal general appearance Vaginal: normal rugae  and presence of blood Cervix: presence of blood from cervical os Adnexa: non palpable Uterus: irregular enlargement Exam limited by body habitus Rectovaginal: not indicated  ENDOMETRIAL BIOPSY  INDICATION: post menopausal bleeding UPT: N/A Risks and benefits have been discussed PROCEDURE Cervix prepped with betadine Tenaculum applied to anterior lip of the cervix: yes Uterus sounded at  9  cm Endometrial biopsy easily performed with Pipelle with 3 passes, only blood visible in specimen Well tolerated Specimen appropriately identified and sent to patholog      Assessment:   Post-menopausal vaginal bleeding  possible etiologies reviewed Known uterine fibroids Hx of endometrial polyps   Plan:  Pap  Endometrial biopsy If both normal, will do SHG in 2 weeks   Dierdre Forth  MD

## 2012-07-17 LAB — PAP IG W/ RFLX HPV ASCU

## 2012-07-19 ENCOUNTER — Other Ambulatory Visit: Payer: Self-pay

## 2012-07-19 ENCOUNTER — Telehealth: Payer: Self-pay

## 2012-07-19 DIAGNOSIS — N950 Postmenopausal bleeding: Secondary | ICD-10-CM

## 2012-07-19 MED ORDER — MISOPROSTOL 200 MCG PO TABS: ORAL_TABLET | ORAL | Status: DC

## 2012-07-19 NOTE — Telephone Encounter (Signed)
t

## 2012-07-27 DIAGNOSIS — M8580 Other specified disorders of bone density and structure, unspecified site: Secondary | ICD-10-CM | POA: Insufficient documentation

## 2012-07-28 ENCOUNTER — Ambulatory Visit: Payer: Private Health Insurance - Indemnity | Admitting: Obstetrics and Gynecology

## 2012-07-28 ENCOUNTER — Other Ambulatory Visit: Payer: Self-pay | Admitting: Obstetrics and Gynecology

## 2012-07-28 ENCOUNTER — Ambulatory Visit: Payer: Private Health Insurance - Indemnity

## 2012-07-28 ENCOUNTER — Encounter: Payer: Self-pay | Admitting: Obstetrics and Gynecology

## 2012-07-28 VITALS — BP 112/72 | Resp 16 | Wt 238.0 lb

## 2012-07-28 DIAGNOSIS — N950 Postmenopausal bleeding: Secondary | ICD-10-CM

## 2012-07-28 DIAGNOSIS — D219 Benign neoplasm of connective and other soft tissue, unspecified: Secondary | ICD-10-CM

## 2012-07-28 NOTE — Patient Instructions (Signed)
Sonohysterography Care After These instructions give you information on caring for yourself after your procedure. Your doctor may also give you more specific instructions. Call your doctor if you have any problems or questions after your procedure. HOME CARE  Take your medicine as told. Finish them even if you start to feel better.  Wear a pad if you have some light bleeding. This is normal.  Do not use tampons, douche, or have sex (intercourse) for 1 week. Finding out the results of your test Ask when your test results will be ready. Make sure you get your test results. GET HELP RIGHT AWAY IF:  You have a temperature by mouth above 102 F (38.9 C), not controlled by medicine.  You have pain in your belly (abomen) that gets worse or is very bad.  You have a lot of bleeding from your vagina. MAKE SURE YOU:  Understand these instructions.  Will watch your condition.  Will get help right away if you are not doing well or get worse. Document Released: 07/10/2010 Document Revised: 08/30/2011 Document Reviewed: 07/10/2010 [facility] Patient Information 2013 [place], [place].

## 2012-07-28 NOTE — Progress Notes (Signed)
  ULTRASOUND: Post Menopausal Bleeding  Uterus: Length: 5.84 cm   Width:  5.47 cm   Height:  4.95 cm   Volume:82.795cm  Endo thickness:  3.72 mm   Left ovary: Length: 2.71cm   Width: 1.41cm   Height: 1.80cm   Volume: 3.601cm  Right ovary: Length: 2.81 cm   Width: 2.27 cm   Height: 1.89 cm   Volume: 6.312cm     Fibroids:yes  Number:  1  Size(s):D1: 4.75 cm  D2: 3.95 cm D3: 4.41 cm Avg.D 4.37 cm Vol 43.253 cm  CDS fluid:no  Comment: Anteverted Uterus, Normal Ovaries, No fluid in CDS, Normal [place].  Fibroid 1. Anterior Intramural  Sonohystergram (Endometrial Mass = 1.3 x 0.95 cm) Blood flow is seen on Color Doppler.  Endometrial Cavity Width = 1.8 CM, Length = 4.3 CM.  Assessment: Post menopausal bleeding Endometrial lesion, probable polyp Family hx uterine cancer (probable leiomyosarcoma)  Recommendation : Hysteroscopy, lesion removal and D&C recommended and accepted

## 2012-08-29 ENCOUNTER — Ambulatory Visit: Payer: Private Health Insurance - Indemnity | Admitting: Obstetrics and Gynecology

## 2012-08-29 ENCOUNTER — Telehealth: Payer: Self-pay | Admitting: Obstetrics and Gynecology

## 2012-08-29 ENCOUNTER — Encounter (HOSPITAL_COMMUNITY): Payer: Self-pay | Admitting: Obstetrics and Gynecology

## 2012-08-29 ENCOUNTER — Encounter: Payer: Self-pay | Admitting: Obstetrics and Gynecology

## 2012-08-29 ENCOUNTER — Encounter: Payer: Private Health Insurance - Indemnity | Admitting: Obstetrics and Gynecology

## 2012-08-29 ENCOUNTER — Other Ambulatory Visit: Payer: Self-pay | Admitting: Obstetrics and Gynecology

## 2012-08-29 VITALS — BP 112/66 | HR 74 | Temp 97.8°F | Resp 14 | Wt 240.0 lb

## 2012-08-29 DIAGNOSIS — N840 Polyp of corpus uteri: Secondary | ICD-10-CM

## 2012-08-29 DIAGNOSIS — N950 Postmenopausal bleeding: Secondary | ICD-10-CM

## 2012-08-29 NOTE — Addendum Note (Signed)
Addended by: Hal Morales on: 08/29/2012 04:49 PM   Modules accepted: Orders

## 2012-08-29 NOTE — Progress Notes (Signed)
Subjective:    Amber Adams is a 57 y.o. female, G2P2002, who presents for pre-op for hysterscopy.  She has had no further bleeding   The following portions of the patient's history were reviewed and updated as appropriate: allergies, current medications, past family history.  Review of Systems Pertinent items are noted in HPI. Breast:Negative for breast lump,nipple discharge or nipple retraction Gastrointestinal: Negative for abdominal pain, change in bowel habits or rectal bleeding Urinary:negative   Objective:    BP 112/66  Pulse 74  Resp 14  Wt 240 lb (108.863 kg)  BMI 38.76 kg/m2  LMP 07/12/2012    Weight:  Wt Readings from Last 1 Encounters:  08/29/12 240 lb (108.863 kg)          BMI: Body mass index is 38.76 kg/(m^2).  General Appearance: Alert, appropriate appearance for age. No acute distress    Assessment:    PMB   Plan:    Hysteroscopy polypectomy d&c See documented H&P for details of exam and HPI

## 2012-08-29 NOTE — Telephone Encounter (Signed)
Hysteroscopy, Lesion Removal; D&C scheduled for 09/04/12 @ 12:15 with [facility]. [employer] effective 06/21/12. Plan pays 80/20, Pre-op du e$87.62. -Adrianne Pridgen

## 2012-08-29 NOTE — Patient Instructions (Addendum)
Hysteroscopy  Hysteroscopy is a procedure used for looking inside the womb (uterus). It may be done for many different reasons, including:  · To evaluate abnormal bleeding, fibroid (benign, noncancerous) tumors, polyps, scar tissue (adhesions), and possibly cancer of the uterus.  · To look for lumps (tumors) and other uterine growths.  · To look for causes of why a woman cannot get pregnant (infertility), causes of recurrent loss of pregnancy (miscarriages), or a lost intrauterine device (IUD).  · To perform a sterilization by blocking the fallopian tubes from inside the uterus.  A hysteroscopy should be done right after a menstrual period to be sure you are not pregnant.  LET YOUR CAREGIVER KNOW ABOUT:   · Allergies.  · Medicines taken, including herbs, eyedrops, over-the-counter medicines, and creams.  · Use of steroids (by mouth or creams).  · Previous problems with anesthetics or numbing medicines.  · History of bleeding or blood problems.  · History of blood clots.  · Possibility of pregnancy, if this applies.  · Previous surgery.  · Other health problems.  RISKS AND COMPLICATIONS   · Putting a hole in the uterus.  · Excessive bleeding.  · Infection.  · Damage to the cervix.  · Injury to other organs.  · Allergic reaction to medicines.  · Too much fluid used in the uterus for the procedure.  BEFORE THE PROCEDURE   · Do not take aspirin or blood thinners for a week before the procedure, or as directed. It can cause bleeding.  · Arrive at least 60 minutes before the procedure or as directed to read and sign the necessary forms.  · Arrange for someone to take you home after the procedure.  · If you smoke, do not smoke for 2 weeks before the procedure.  PROCEDURE   · Your caregiver may give you medicine to relax you. He or she may also give you a medicine that numbs the area around the cervix (local anesthetic) or a medicine that makes you sleep (general anesthesia).  · Sometimes, a medicine is placed in the cervix  the day before the procedure. This medicine makes the cervix have a larger opening (dilate). This makes it easier for the instrument to be inserted into the uterus.  · A small instrument (hysteroscope) is inserted through the vagina into the uterus. This instrument is similar to a pencil-sized telescope with a light.  · During the procedure, air or a liquid is put into the uterus, which allows the surgeon to see better.  · Sometimes, tissue is gently scraped from inside the uterus. These tissue samples are sent to a specialist who looks at tissue samples (pathologist). The pathologist will give a report to your caregiver. This will help your caregiver decide if further treatment is necessary. The report will also help your caregiver decide on the best treatment if the test comes back abnormal.  AFTER THE PROCEDURE   · If you had a general anesthetic, you may be groggy for a couple hours after the procedure.  · If you had a local anesthetic, you will be advised to rest at the surgical center or caregiver's office until you are stable and feel ready to go home.  · You may have some cramping for a couple days.  · You may have bleeding, which varies from light spotting for a few days to menstrual-like bleeding for up to 3 to 7 days. This is normal.  · Have someone take you home.  FINDING OUT THE   RESULTS OF YOUR TEST  Not all test results are available during your visit. If your test results are not back during the visit, make an appointment with your caregiver to find out the results. Do not assume everything is normal if you have not heard from your caregiver or the medical facility. It is important for you to follow up on all of your test results.  HOME CARE INSTRUCTIONS   · Do not drive for 24 hours or as instructed.  · Only take over-the-counter or prescription medicines for pain, discomfort, or fever as directed by your caregiver.  · Do not take aspirin. It can cause or aggravate bleeding.  · Do not drive or drink  alcohol while taking pain medicine.  · You may resume your usual diet.  · Do not use tampons, douche, or have sexual intercourse for 2 weeks, or as advised by your caregiver.  · Rest and sleep for the first 24 to 48 hours.  · Take your temperature twice a day for 4 to 5 days. Write it down. Give these temperatures to your caregiver if they are abnormal (above 98.6° F or 37.0° C).  · Take medicines your caregiver has ordered as directed.  · Follow your caregiver's advice regarding diet, exercise, lifting, driving, and general activities.  · Take showers instead of baths for 2 weeks, or as recommended by your caregiver.  · If you develop constipation:  · Take a mild laxative with the advice of your caregiver.  · Eat bran foods.  · Drink enough water and fluids to keep your urine clear or pale yellow.  · Try to have someone with you or available to you for the first 24 to 48 hours, especially if you had a general anesthetic.  · Make sure you and your family understand everything about your operation and recovery.  · Follow your caregiver's advice regarding follow-up appointments and Pap smears.  SEEK MEDICAL CARE IF:   · You feel dizzy or lightheaded.  · You feel sick to your stomach (nauseous).  · You develop abnormal vaginal discharge.  · You develop a rash.  · You have an abnormal reaction or allergy to your medicine.  · You need stronger pain medicine.  SEEK IMMEDIATE MEDICAL CARE IF:   · Bleeding is heavier than a normal menstrual period or you have blood clots.  · You have an oral temperature above 102° F (38.9° C), not controlled by medicine.  · You have increasing cramps or pains not relieved with medicine.  · You develop belly (abdominal) pain that does not seem to be related to the same area of earlier cramping and pain.  · You pass out.  · You develop pain in the tops of your shoulders (shoulder strap areas).  · You develop shortness of breath.  MAKE SURE YOU:   · Understand these instructions.  · Will watch  your condition.  · Will get help right away if you are not doing well or get worse.  Document Released: 09/13/2000 Document Revised: 08/30/2011 Document Reviewed: 01/06/2009  Exit[employer]® Patient Information ©2013 [employer].

## 2012-08-29 NOTE — H&P (Signed)
Amber Adams is an 57 y.o. female. Presents for hysteroscopy in workup and treatment of postmenopausal bleeding.  Pertinent Gynecological History: Menses: LMP 2006 Bleeding: post menopausal bleeding Contraception: vasectomy DES exposure: unknown Blood transfusions: none Sexually transmitted diseases: no past history Previous GYN Procedures: hysteroscopy for PMB 2009  Last mammogram: normal Date: 03/2012 Last pap: normal Date: 06/2012 OB History: G2, P2   Menstrual History: Menarche age: 39  Patient's last menstrual period was 07/12/2012.    Past Medical History  Diagnosis Date  . H/O ulcerative colitis 1986  . H/O hemorrhoids   . Abnormal Pap smear 1981  . Hypertension   . Diabetes mellitus   . PMB (postmenopausal bleeding) 2009  . Yeast infection 1997  . Urinary incontinence 1998  . Fibroid   . SUI (stress urinary incontinence), female 2003  . H/O amenorrhea 2003  . Endometrial polyp 2009  . Apnea, sleep 01/06/11  . Osteopenia   . Low back pain     bulging disc-cortisone injections    Past Surgical History  Procedure Laterality Date  . Combined hysteroscopy diagnostic / d&c  2009    endometrial polyp and menopausal bleeding  . Ovarian cyst removal  1979  . Ovarian cystectomy Left 1995    dermoid  . Foot neuroma surgery Right 2000    Family History  Problem Relation Age of Onset  . Hypertension Maternal Uncle   . Heart disease Maternal Uncle     MI  . Vision loss Paternal Uncle   . Diabetes Maternal Grandmother   . Cancer Maternal Grandmother     Ovarian  . Cancer Mother     leiomyosarcoma    Social History:  reports that she has never smoked. She has never used smokeless tobacco. She reports that  drinks alcohol. She reports that she does not use illicit drugs.  Allergies:  Allergies  Allergen Reactions  . Asa (Aspirin) Hives and Itching  . Beeswax Hives  . Lialda (Mesalamine) Hives and Itching  . Sulfa Drugs Cross Reactors Rash     Prescriptions prior to admission  Medication Sig Dispense Refill  . Colesevelam HCl [facility]) 3.75 G PACK Take 1 packet by mouth daily.      . fish oil-omega-3 fatty acids 1000 MG capsule Take 1 g by mouth daily.      . Multiple Vitamin (MULTIVITAMIN WITH MINERALS) TABS Take 1 tablet by mouth daily.      . nebivolol (BYSTOLIC) 5 MG tablet Take 5 mg by mouth daily.      . sitaGLIPtin (JANUVIA) 100 MG tablet Take 100 mg by mouth daily.      . valsartan-hydrochlorothiazide (DIOVAN-HCT) 160-25 MG per tablet Take 1 tablet by mouth daily.        Review of Systems  Constitutional: Negative.   HENT: Negative.   Eyes: Negative.   Respiratory: Negative.   Cardiovascular: Negative.   Gastrointestinal:       Abdominal bloating   Genitourinary: Negative.   Musculoskeletal: Negative.   Skin: Negative.   Neurological: Negative.   Endo/Heme/Allergies: Negative.   Psychiatric/Behavioral: Negative.     Blood pressure 126/78, pulse 60, temperature 97.9 F (36.6 C), temperature source Oral, resp. rate 18, last menstrual period 07/12/2012, SpO2 100.00%. Physical Exam  Constitutional: She is oriented to person, place, and time. She appears well-developed and well-nourished.  HENT:  Head: Normocephalic and atraumatic.  Eyes: Conjunctivae and EOM are normal. Pupils are equal, round, and reactive to light.  Neck: Normal range of motion.  Neck supple.  Cardiovascular: Normal rate, regular rhythm and normal heart sounds.   Respiratory: Effort normal and breath sounds normal.  GI: Soft. Bowel sounds are normal.  Genitourinary: Vagina normal.  Musculoskeletal: Normal range of motion.  Neurological: She is alert and oriented to person, place, and time. She has normal reflexes.  Skin: Skin is warm and dry.  Psychiatric: She has a normal mood and affect.   ULTRASOUND: done 07/2012 at [facility] Uterus:  Length: 5.84 cm    Width: 5.47 cm    Height: 4.95 cm    Volume:82.795cm    Endo thickness: 3.72  mm  Left ovary:  Length: 2.71cm    Width: 1.41cm    Height: 1.80cm    Volume: 3.601cm  Right ovary:  Length: 2.81 cm    Width: 2.27 cm    Height: 1.89 cm    Volume: 6.312cm  Fibroids:yes   Anterior Intramural  Number: 1  Size   Avg.D 4.37 cm Vol 43.253 cm  CDS fluid:no  Comment:  Anteverted Uterus, Normal Ovaries, No fluid in CDS, Normal [place].  Fibroid  1.  Sonohystergram  (Endometrial Mass = 1.3 x 0.95 cm) Blood flow is seen on Color Doppler.     Endometrial Cavity Width = 1.8 CM, Length = 4.3 CM.  Results for orders placed during the hospital encounter of 09/01/12 (from the past 24 hour(s))  GLUCOSE, CAPILLARY     Status: Abnormal   Collection Time    09/01/12  6:06 AM      Result Value Range   Glucose-Capillary 107 (*) 70 - 99 mg/dL    No results found.  Assessment Postmenopausal bleeding Endometrial mass on sonohysterogram Family hx of leiomyosarcoma  Plan:  Hysteroscopy recommended and accepted.  The indications reviewed.  Risks of anesthesia, bleeding infection, damage to adjacent organs and risk of perforation all reviewed. The pt has had a preop cardiology evaulation and has been cleared for her procedure.  She denies chest pain.  She will have as more complete cardiac workup post op.  HAYGOOD,VANESSA P 09/01/2012, 7:34 AM

## 2012-08-29 NOTE — Telephone Encounter (Signed)
Hysteroscopy, Lesion Removal; D&C rescheduled to 09/01/12 @ 7:30am with [facility]. [employer] effective 06/21/12. Plan pays 80/20 after a $1,600 deductible. Pre-op due $87.62. -Adrianne Pridgen

## 2012-08-30 ENCOUNTER — Encounter (HOSPITAL_COMMUNITY)
Admission: RE | Admit: 2012-08-30 | Discharge: 2012-08-30 | Disposition: A | Discharge status: Home / Self Care | Payer: Managed Care, Other (non HMO) | Source: Ambulatory Visit | Attending: Obstetrics and Gynecology | Admitting: Obstetrics and Gynecology

## 2012-08-30 ENCOUNTER — Other Ambulatory Visit: Payer: Self-pay

## 2012-08-30 ENCOUNTER — Encounter (HOSPITAL_COMMUNITY): Payer: Self-pay

## 2012-08-30 HISTORY — DX: Low back pain, unspecified: M54.50

## 2012-08-30 HISTORY — DX: Low back pain: M54.5

## 2012-08-30 LAB — BASIC METABOLIC PANEL
BUN: 19 mg/dL (ref 6–23)
CO2: 30 mEq/L (ref 19–32)
Chloride: 102 mEq/L (ref 96–112)
Glucose, Bld: 96 mg/dL (ref 70–99)
Potassium: 3.9 mEq/L (ref 3.5–5.1)

## 2012-08-30 LAB — CBC
HCT: 39 % (ref 36.0–46.0)
Hemoglobin: 13.3 g/dL (ref 12.0–15.0)
MCHC: 34.1 g/dL (ref 30.0–36.0)
RBC: 4.16 MIL/uL (ref 3.87–5.11)
WBC: 9 10*3/uL (ref 4.0–10.5)

## 2012-08-30 NOTE — Pre-Procedure Instructions (Signed)
EKG reviewed per Dr Sherron Ales -SB with LAD, T wave abnormality consider lateral ishemia-no EKG for comparison-PCP office called-Dr Bland-Dr Sherron Ales spoke with Dr Vallery Sa Cardiac Clearance-requested me to call office for Adrianne to set up appointment for clearance. Done-EKG sent with patient.

## 2012-08-30 NOTE — Patient Instructions (Addendum)
   Your procedure is scheduled OZ:HYQMVH March 14th  Enter through the Main Entrance of [facility] at:6am Pick up the phone at the desk and dial [phone] and inform us of your arrival.  Please call this number if you have any problems the morning of surgery: [phone]  Remember: Do not eat or drink anything after midnight on Thursday Hold your Januvia morning of surgery, Take your blood pressure medicines morning of surgery with sips of water  Do not wear jewelry, make-up, or FINGER nail polish No metal in your hair or on your body. Do not wear lotions, powders, perfumes. You may wear deodorant.  Please use your CHG wash as directed prior to surgery.  Do not shave anywhere for at least 12 hours prior to first CHG shower.  Do not bring valuables to the hospital. Contacts may not be worn into surgery.    Patients discharged on the day of surgery will not be allowed to drive home.

## 2012-08-31 ENCOUNTER — Encounter: Payer: Self-pay | Admitting: Cardiovascular Disease

## 2012-08-31 ENCOUNTER — Ambulatory Visit (INDEPENDENT_AMBULATORY_CARE_PROVIDER_SITE_OTHER): Payer: Managed Care, Other (non HMO) | Admitting: Cardiovascular Disease

## 2012-08-31 VITALS — BP 118/84 | HR 68 | Wt 240.0 lb

## 2012-08-31 DIAGNOSIS — R0600 Dyspnea, unspecified: Secondary | ICD-10-CM

## 2012-08-31 DIAGNOSIS — R0989 Other specified symptoms and signs involving the circulatory and respiratory systems: Secondary | ICD-10-CM

## 2012-08-31 DIAGNOSIS — E119 Type 2 diabetes mellitus without complications: Secondary | ICD-10-CM

## 2012-08-31 DIAGNOSIS — I10 Essential (primary) hypertension: Secondary | ICD-10-CM

## 2012-08-31 DIAGNOSIS — R0609 Other forms of dyspnea: Secondary | ICD-10-CM

## 2012-08-31 DIAGNOSIS — R9431 Abnormal electrocardiogram [ECG] [EKG]: Secondary | ICD-10-CM

## 2012-08-31 DIAGNOSIS — Z01810 Encounter for preprocedural cardiovascular examination: Secondary | ICD-10-CM

## 2012-08-31 NOTE — Patient Instructions (Addendum)
Your physician recommends that you schedule a follow-up appointment in:  AS NEEDED  Your physician recommends that you continue on your current medications as directed. Please refer to the Current Medication list given to you today.  Your physician has requested that you have an echocardiogram. Echocardiography is a painless test that uses sound waves to create images of your heart. It provides your doctor with information about the size and shape of your heart and how well your heart's chambers and valves are working. This procedure takes approximately one hour. There are no restrictions for this procedure.   Your physician has requested that you have en exercise stress myoview. For further information please visit [email or web address]. Please follow instruction sheet, as given.  

## 2012-08-31 NOTE — Assessment & Plan Note (Signed)
Discussed low carb diet.  Target hemoglobin A1c is 6.5 or less.  Continue current medications.  

## 2012-08-31 NOTE — Assessment & Plan Note (Signed)
Well controlled.  Continue current medications and low sodium Dash type diet.    

## 2012-08-31 NOTE — Assessment & Plan Note (Signed)
Patient has no chest pain and normal exam.  She is having a low risk procedure Called Dr Pennie Rushing and clear to have surgery in am That being said she has multiple CRF;s including DM with abnormal ECG needs f/u stress myovue and echo and this can be done after her procedure

## 2012-08-31 NOTE — Progress Notes (Signed)
Patient ID: Amber Adams, female   DOB: [birth year], 56 y.o.   MRN: [id number] 57 yo patient referred for preop clearance.  She has multiple CRF;s including family history, DM, HTN and elevated lipids.  She is scheduled to have a D and C by Dr Pennie Rushing tomorrow for a polyp She had similar procedure a few years ago with no problems.  She is overweght and more sedentary this past year Works for [employer] at [employer] job.  Some exertional dyspnea that seems functional.  No chest pain.  Compliant with meds. No previous bleeding or anesthesia issues.    ROS: Denies fever, malais, weight loss, blurry vision, decreased visual acuity, cough, sputum, SOB, hemoptysis, pleuritic pain, palpitaitons, heartburn, abdominal pain, melena, lower extremity edema, claudication, or rash.  All other systems reviewed and negative   General: Affect appropriate Healthy:  appears stated age HEENT: normal Neck supple with no adenopathy JVP normal no bruits no thyromegaly Lungs clear with no wheezing and good diaphragmatic motion Heart:  S1/S2 no murmur,rub, gallop or click PMI normal Abdomen: benighn, BS positve, no tenderness, no AAA no bruit.  No HSM or HJR Distal pulses intact with no bruits No edema Neuro non-focal Skin warm and dry No muscular weakness  Medications Current Outpatient Prescriptions  Medication Sig Dispense Refill  . Colesevelam HCl [facility]) 3.75 G PACK Take 1 packet by mouth daily.      . fish oil-omega-3 fatty acids 1000 MG capsule Take 1 g by mouth daily.      . Multiple Vitamin (MULTIVITAMIN WITH MINERALS) TABS Take 1 tablet by mouth daily.      . nebivolol (BYSTOLIC) 5 MG tablet Take 5 mg by mouth daily.      . sitaGLIPtin (JANUVIA) 100 MG tablet Take 100 mg by mouth daily.      . valsartan-hydrochlorothiazide (DIOVAN-HCT) 160-25 MG per tablet Take 1 tablet by mouth daily.       No current facility-administered medications for this visit.    Allergies Asa; Beeswax; Lialda; and Sulfa  drugs cross reactors  Family History: Family History  Problem Relation Age of Onset  . Hypertension Maternal Uncle   . Heart disease Maternal Uncle     MI  . Vision loss Paternal Uncle   . Diabetes Maternal Grandmother   . Cancer Maternal Grandmother     Ovarian  . Cancer Mother     leiomyosarcoma    Social History: History   Social History  . Marital Status: Married    Spouse Name: N/A    Number of Children: N/A  . Years of Education: N/A   Occupational History  . Not on file.   Social History Main Topics  . Smoking status: Never Smoker   . Smokeless tobacco: Never Used  . Alcohol Use: Yes     Comment: rare  . Drug Use: No  . Sexually Active: Yes    Birth Control/ Protection: Post-menopausal   Other Topics Concern  . Not on file   Social History Narrative  . No narrative on file    Electrocardiogram:  SR rate 56 biphasic T waves in inferolateral leads  Assessment and Plan

## 2012-09-01 ENCOUNTER — Encounter (HOSPITAL_COMMUNITY): Payer: Self-pay | Admitting: Anesthesiology

## 2012-09-01 ENCOUNTER — Ambulatory Visit (HOSPITAL_COMMUNITY)
Admission: RE | Admit: 2012-09-01 | Discharge: 2012-09-01 | Disposition: A | Discharge status: Home / Self Care | Payer: Managed Care, Other (non HMO) | Source: Ambulatory Visit | Attending: Obstetrics and Gynecology | Admitting: Obstetrics and Gynecology

## 2012-09-01 ENCOUNTER — Ambulatory Visit (HOSPITAL_COMMUNITY): Payer: Managed Care, Other (non HMO) | Admitting: Anesthesiology

## 2012-09-01 ENCOUNTER — Encounter (HOSPITAL_COMMUNITY)
Admission: RE | Disposition: A | Discharge status: Home / Self Care | Payer: Self-pay | Source: Ambulatory Visit | Attending: Obstetrics and Gynecology

## 2012-09-01 DIAGNOSIS — I10 Essential (primary) hypertension: Secondary | ICD-10-CM | POA: Insufficient documentation

## 2012-09-01 DIAGNOSIS — N950 Postmenopausal bleeding: Secondary | ICD-10-CM | POA: Insufficient documentation

## 2012-09-01 DIAGNOSIS — E119 Type 2 diabetes mellitus without complications: Secondary | ICD-10-CM | POA: Insufficient documentation

## 2012-09-01 HISTORY — PX: DILATATION & CURRETTAGE/HYSTEROSCOPY WITH RESECTOCOPE: SHX5572

## 2012-09-01 LAB — GLUCOSE, CAPILLARY: Glucose-Capillary: 107 mg/dL — ABNORMAL HIGH (ref 70–99)

## 2012-09-01 SURGERY — DILATATION & CURETTAGE/HYSTEROSCOPY WITH RESECTOCOPE
Anesthesia: General | Wound class: Clean Contaminated

## 2012-09-01 MED ORDER — ACETAMINOPHEN 500 MG PO TABS: ORAL_TABLET | ORAL | Status: AC

## 2012-09-01 MED ORDER — LIDOCAINE HCL 2 % IJ SOLN
INTRAMUSCULAR | Status: AC
  Filled 2012-09-01: qty 20

## 2012-09-01 MED ORDER — FENTANYL CITRATE 0.05 MG/ML IJ SOLN
INTRAMUSCULAR | Status: DC | PRN
  Administered 2012-09-01 (×3): 50 ug via INTRAVENOUS

## 2012-09-01 MED ORDER — LIDOCAINE HCL (CARDIAC) 20 MG/ML IV SOLN
INTRAVENOUS | Status: DC | PRN
  Administered 2012-09-01: 80 mg via INTRAVENOUS

## 2012-09-01 MED ORDER — FENTANYL CITRATE 0.05 MG/ML IJ SOLN: 25.0000 ug | INTRAMUSCULAR | Status: DC | PRN

## 2012-09-01 MED ORDER — ONDANSETRON HCL 4 MG/2ML IJ SOLN
INTRAMUSCULAR | Status: DC | PRN
  Administered 2012-09-01: 4 mg via INTRAVENOUS

## 2012-09-01 MED ORDER — LIDOCAINE HCL (CARDIAC) 20 MG/ML IV SOLN
INTRAVENOUS | Status: AC
  Filled 2012-09-01: qty 5

## 2012-09-01 MED ORDER — MEPERIDINE HCL 25 MG/ML IJ SOLN: 6.2500 mg | INTRAMUSCULAR | Status: DC | PRN

## 2012-09-01 MED ORDER — MIDAZOLAM HCL 2 MG/2ML IJ SOLN: 0.5000 mg | Freq: Once | INTRAMUSCULAR | Status: DC | PRN

## 2012-09-01 MED ORDER — MIDAZOLAM HCL 5 MG/5ML IJ SOLN
INTRAMUSCULAR | Status: DC | PRN
  Administered 2012-09-01: 1 mg via INTRAVENOUS

## 2012-09-01 MED ORDER — PROMETHAZINE HCL 25 MG/ML IJ SOLN: 6.2500 mg | INTRAMUSCULAR | Status: DC | PRN

## 2012-09-01 MED ORDER — PHENYLEPHRINE HCL 10 MG/ML IJ SOLN
INTRAMUSCULAR | Status: DC | PRN
  Administered 2012-09-01: 40 ug via INTRAVENOUS

## 2012-09-01 MED ORDER — EPHEDRINE SULFATE 50 MG/ML IJ SOLN
INTRAMUSCULAR | Status: DC | PRN
  Administered 2012-09-01 (×2): 5 mg via INTRAVENOUS

## 2012-09-01 MED ORDER — ONDANSETRON HCL 4 MG/2ML IJ SOLN
INTRAMUSCULAR | Status: AC
  Filled 2012-09-01: qty 2

## 2012-09-01 MED ORDER — GLYCOPYRROLATE 0.2 MG/ML IJ SOLN
INTRAMUSCULAR | Status: DC | PRN
  Administered 2012-09-01 (×2): 0.1 mg via INTRAVENOUS

## 2012-09-01 MED ORDER — PROPOFOL 10 MG/ML IV EMUL
INTRAVENOUS | Status: DC | PRN
  Administered 2012-09-01: 250 mg via INTRAVENOUS

## 2012-09-01 MED ORDER — GLYCINE 1.5 % IR SOLN
Status: DC | PRN
  Administered 2012-09-01: 1200 mL

## 2012-09-01 MED ORDER — LIDOCAINE HCL 2 % IJ SOLN
INTRAMUSCULAR | Status: DC | PRN
  Administered 2012-09-01: 10 mL

## 2012-09-01 MED ORDER — PROPOFOL 10 MG/ML IV EMUL
INTRAVENOUS | Status: AC
  Filled 2012-09-01: qty 20

## 2012-09-01 MED ORDER — GLYCOPYRROLATE 0.2 MG/ML IJ SOLN
INTRAMUSCULAR | Status: AC
  Filled 2012-09-01: qty 1

## 2012-09-01 MED ORDER — OXYCODONE-ACETAMINOPHEN 5-325 MG PO TABS: 1.0000 | ORAL_TABLET | ORAL | Status: DC | PRN

## 2012-09-01 MED ORDER — DEXAMETHASONE SODIUM PHOSPHATE 10 MG/ML IJ SOLN
INTRAMUSCULAR | Status: AC
  Filled 2012-09-01: qty 1

## 2012-09-01 MED ORDER — FENTANYL CITRATE 0.05 MG/ML IJ SOLN
INTRAMUSCULAR | Status: AC
  Filled 2012-09-01: qty 5

## 2012-09-01 MED ORDER — LACTATED RINGERS IV SOLN
INTRAVENOUS | Status: DC
  Administered 2012-09-01 (×2): via INTRAVENOUS

## 2012-09-01 MED ORDER — PHENYLEPHRINE 40 MCG/ML (10ML) SYRINGE FOR IV PUSH (FOR BLOOD PRESSURE SUPPORT)
PREFILLED_SYRINGE | INTRAVENOUS | Status: AC
  Filled 2012-09-01: qty 5

## 2012-09-01 MED ORDER — MIDAZOLAM HCL 2 MG/2ML IJ SOLN
INTRAMUSCULAR | Status: AC
  Filled 2012-09-01: qty 2

## 2012-09-01 MED ORDER — EPHEDRINE 5 MG/ML INJ
INTRAVENOUS | Status: AC
  Filled 2012-09-01: qty 10

## 2012-09-01 SURGICAL SUPPLY — 28 items
BOOTIES KNEE HIGH SLOAN (MISCELLANEOUS) ×4 IMPLANT
CANISTER SUCTION 2500CC (MISCELLANEOUS) ×2 IMPLANT
CATH ROBINSON RED A/P 16FR (CATHETERS) ×2 IMPLANT
CLOTH BEACON ORANGE TIMEOUT ST (SAFETY) ×2 IMPLANT
CONTAINER PREFILL 10% NBF 60ML (FORM) ×4 IMPLANT
CORD ACTIVE DISPOSABLE (ELECTRODE) ×1
CORD ELECTRO ACTIVE DISP (ELECTRODE) ×1 IMPLANT
DILATOR CANAL MILEX (MISCELLANEOUS) IMPLANT
DRESSING TELFA 8X3 (GAUZE/BANDAGES/DRESSINGS) ×2 IMPLANT
ELECT LOOP GYNE PRO 24FR (CUTTING LOOP)
ELECT REM PT RETURN 9FT ADLT (ELECTROSURGICAL) ×2
ELECT VAPORTRODE GRVD BAR (ELECTRODE) IMPLANT
ELECTRODE LOOP GYNE PRO 24FR (CUTTING LOOP) IMPLANT
ELECTRODE REM PT RTRN 9FT ADLT (ELECTROSURGICAL) ×1 IMPLANT
ELECTRODE ROLLER VERSAPOINT (ELECTRODE) IMPLANT
ELECTRODE RT ANGLE VERSAPOINT (CUTTING LOOP) IMPLANT
ELECTRODE TWIZZLE TIP (MISCELLANEOUS) IMPLANT
GLOVE ECLIPSE 6.0 STRL STRAW (GLOVE) ×2 IMPLANT
GLOVE ECLIPSE 6.5 STRL STRAW (GLOVE) ×1 IMPLANT
GLOVE INDICATOR 6.5 STRL GRN (GLOVE) ×2 IMPLANT
GLOVE INDICATOR 7.0 STRL GRN (GLOVE) ×1 IMPLANT
GLOVE SURG SS PI 6.5 STRL IVOR (GLOVE) ×5 IMPLANT
GOWN STRL REIN XL XLG (GOWN DISPOSABLE) ×4 IMPLANT
LOOP ANGLED CUTTING 22FR (CUTTING LOOP) IMPLANT
PACK HYSTEROSCOPY LF (CUSTOM PROCEDURE TRAY) ×2 IMPLANT
PAD OB MATERNITY 4.3X12.25 (PERSONAL CARE ITEMS) ×2 IMPLANT
TOWEL OR 17X24 6PK STRL BLUE (TOWEL DISPOSABLE) ×4 IMPLANT
WATER STERILE IRR 1000ML POUR (IV SOLUTION) ×2 IMPLANT

## 2012-09-01 NOTE — Anesthesia Preprocedure Evaluation (Addendum)
Anesthesia Evaluation  Patient identified by MRN, date of birth, ID band Patient awake    Reviewed: Allergy & Precautions, H&P , Patient's Chart, lab work & pertinent test results, reviewed documented beta blocker date and time   History of Anesthesia Complications Negative for: history of anesthetic complications  Airway Mallampati: III TM Distance: >3 FB Neck ROM: full    Dental no notable dental hx.    Pulmonary neg pulmonary ROS, sleep apnea ,  breath sounds clear to auscultation  Pulmonary exam normal       Cardiovascular Exercise Tolerance: Good hypertension, negative cardio ROS  Rhythm:regular Rate:Normal     Neuro/Psych negative neurological ROS  negative psych ROS   GI/Hepatic negative GI ROS, Neg liver ROS,   Endo/Other  negative endocrine ROSdiabetesMorbid obesity  Renal/GU negative Renal ROS     Musculoskeletal   Abdominal   Peds  Hematology negative hematology ROS (+)   Anesthesia Other Findings H/O ulcerative colitis 1986   H/O hemorrhoids        Abnormal Pap smear 1981    Hypertension     EKG V5-6 abnl, [place] cleared for Surgery per personal communication from Dr Pennie Rushing   Diabetes mellitus     PMB (postmenopausal bleeding) 2009       Fibroid     SUI (stress urinary incontinence), female 2003      H/O amenorrhea 2003   Endometrial polyp 2009      Apnea, sleep 01/06/11   Osteopenia        Low back pain   bulging disc-cortisone injections      Reproductive/Obstetrics negative OB ROS                          Anesthesia Physical Anesthesia Plan  ASA: III  Anesthesia Plan: General LMA   Post-op Pain Management:    Induction:   Airway Management Planned:   Additional Equipment:   Intra-op Plan:   Post-operative Plan:   Informed Consent: I have reviewed the patients History and Physical, chart, labs and discussed the procedure including the risks, benefits and  alternatives for the proposed anesthesia with the patient or authorized representative who has indicated his/her understanding and acceptance.   Dental Advisory Given  Plan Discussed with: CRNA, Surgeon and Anesthesiologist  Anesthesia Plan Comments:        position awake Anesthesia Quick Evaluation

## 2012-09-01 NOTE — Op Note (Signed)
09/01/2012  8:44 AM  PATIENT:  Amber Adams  57 y.o. female  PRE-OPERATIVE DIAGNOSIS:  Post Menopausal Bleeding  POST-OPERATIVE DIAGNOSIS:  Post Menopausal Bleeding  PROCEDURE:  Procedure(s): DILATATION & CURETTAGE/HYSTEROSCOPY WITH RESECTOCOPE  SURGEON:  HAYGOOD,VANESSA P, MD  ASSISTANTS: none  ANESTHESIA:   general  ESTIMATED BLOOD LOSS: * No blood loss amount entered *   BLOOD ADMINISTERED:none  COMPLICATIONS:none  FINDINGS: The uterus sounded to 8 cm. At hysteroscopy the endometrial cavity was atrophic with a 2 mm irregularity in the right posterior uterus. This appeared most consistent with a fibroid. The tubal ostia were noted bilaterally.  FLUID DEFICIT: 100cc  LOCAL MEDICATIONS USED:  XYLOCAINE   SPECIMEN:  Source of Specimen:  Endometrial curettings and fibroid resection  DISPOSITION OF SPECIMEN:  PATHOLOGY  COUNTS:  YES  DESCRIPTION OF PROCEDURE:the patient was taken to the operating room after appropriate identification and placed on the operating table. After the attainment of adequate general anesthesia she was placed in the lithotomy position. The perineum and vagina were prepped with multiple layers of Betadine. The bladder was emptied with a an in and out catheter. The perineum was draped in sterile field. A graves speculum was placed in the vagina. The cervix was grasped with a single-tooth tenaculum. A paracervical block was achieved with a total of 10 cc of 2% Xylocaine and the 5 and 7:00 positions. The uterus was sounded.  The cervix was then dilated to accommodate the diagnostic hysteroscope. The hysteroscope was used to evaluate all quadrants of the uterus. The above-noted findings were made and documented. The resectoscope was used to remove the small posterior uterine lesion, most consistent with a fibroid. All instruments were then removed from the vagina and the patient was awakened from general anesthesia and taken to the recovery room in  satisfactory condition having tolerated the procedure well sponge and instrument counts correct.  PLAN OF CARE: Discharge home  PATIENT DISPOSITION:  PACU - hemodynamically stable.   Delay start of Pharmacological VTE agent (>24hrs) due to surgical blood loss or risk of bleeding:  not applicable.  Patient used SCDs during the procedure.   Hal Morales, MD 8:44 AM

## 2012-09-01 NOTE — Transfer of Care (Signed)
Immediate Anesthesia Transfer of Care Note  Patient: Amber Adams  Procedure(s) Performed: Procedure(s): DILATATION & CURETTAGE/HYSTEROSCOPY WITH RESECTOCOPE (N/A)  Patient Location: PACU  Anesthesia Type:General  Level of Consciousness: awake, alert , oriented and patient cooperative  Airway & Oxygen Therapy: Patient Spontanous Breathing and Patient connected to nasal cannula oxygen  Post-op Assessment: Report given to PACU RN and Post -op Vital signs reviewed and stable  Post vital signs: stable  Complications: No apparent anesthesia complications

## 2012-09-01 NOTE — Anesthesia Postprocedure Evaluation (Signed)
  Anesthesia Post-op Note  Patient: Amber Adams  Procedure(s) Performed: Procedure(s): DILATATION & CURETTAGE/HYSTEROSCOPY WITH RESECTOCOPE (N/A)  Patient is awake and responsive. Pain and nausea are reasonably well controlled. Vital signs are stable and clinically acceptable. Oxygen saturation is clinically acceptable. There are no apparent anesthetic complications at this time. Patient is ready for discharge.

## 2012-09-04 ENCOUNTER — Encounter (HOSPITAL_COMMUNITY): Payer: Self-pay | Admitting: Obstetrics and Gynecology

## 2012-09-13 ENCOUNTER — Ambulatory Visit (HOSPITAL_COMMUNITY): Payer: Managed Care, Other (non HMO) | Attending: Cardiology | Admitting: Radiology

## 2012-09-13 ENCOUNTER — Ambulatory Visit (HOSPITAL_COMMUNITY): Payer: Managed Care, Other (non HMO) | Attending: Cardiology

## 2012-09-13 ENCOUNTER — Other Ambulatory Visit (HOSPITAL_COMMUNITY): Payer: Self-pay | Admitting: *Deleted

## 2012-09-13 VITALS — Ht 66.0 in | Wt 238.0 lb

## 2012-09-13 DIAGNOSIS — R0602 Shortness of breath: Secondary | ICD-10-CM

## 2012-09-13 DIAGNOSIS — R0609 Other forms of dyspnea: Secondary | ICD-10-CM | POA: Insufficient documentation

## 2012-09-13 DIAGNOSIS — R9431 Abnormal electrocardiogram [ECG] [EKG]: Secondary | ICD-10-CM | POA: Insufficient documentation

## 2012-09-13 DIAGNOSIS — R0989 Other specified symptoms and signs involving the circulatory and respiratory systems: Secondary | ICD-10-CM | POA: Insufficient documentation

## 2012-09-13 DIAGNOSIS — I10 Essential (primary) hypertension: Secondary | ICD-10-CM | POA: Insufficient documentation

## 2012-09-13 DIAGNOSIS — R0600 Dyspnea, unspecified: Secondary | ICD-10-CM

## 2012-09-13 DIAGNOSIS — E119 Type 2 diabetes mellitus without complications: Secondary | ICD-10-CM | POA: Insufficient documentation

## 2012-09-13 DIAGNOSIS — G4733 Obstructive sleep apnea (adult) (pediatric): Secondary | ICD-10-CM | POA: Insufficient documentation

## 2012-09-13 DIAGNOSIS — Z8249 Family history of ischemic heart disease and other diseases of the circulatory system: Secondary | ICD-10-CM | POA: Insufficient documentation

## 2012-09-13 DIAGNOSIS — R5381 Other malaise: Secondary | ICD-10-CM | POA: Insufficient documentation

## 2012-09-13 MED ORDER — TECHNETIUM TC 99M SESTAMIBI GENERIC - CARDIOLITE
33.0000 | Freq: Once | INTRAVENOUS | Status: AC | PRN
  Administered 2012-09-13: 33 via INTRAVENOUS

## 2012-09-13 MED ORDER — PERFLUTREN PROTEIN A MICROSPH IV SUSP
2.0000 mL | Freq: Once | INTRAVENOUS | Status: AC
  Administered 2012-09-13: 2 mL via INTRAVENOUS

## 2012-09-13 NOTE — Progress Notes (Signed)
[facility] SITE 3 NUCLEAR MED [address] [place], [place] [address] [phone]    Cardiology Nuclear Med Study  Amber Adams is a 57 y.o. female     MRN : [id number]     DOB: 1956/06/09  Procedure Date: 09/13/2012  Nuclear Med Background Indication for Stress Test:  Evaluation for Ischemia and Abnormal EKG History:  No prior known history of CAD, OSA/CPAP Cardiac Risk Factors: Family History - CAD, Hypertension, Lipids and NIDDM  Symptoms:  DOE and Fatigue with Exertion   Nuclear Pre-Procedure Caffeine/Decaff Intake:  None > 12 hrs NPO After: 8:00pm   Lungs:  clear O2 Sat: 98% on room air. IV 0.9% NS with Angio Cath:  22g  IV Site: R Forearm x 1, tolerated well IV Started by:  Irean Hong, RN  Chest Size (in):  40 Cup Size: DD  Height: 5\' 6"  (1.676 m)  Weight:  238 lb (107.956 kg)  BMI:  Body mass index is 38.43 kg/(m^2). Tech Comments:  Held Bystolic x 24 hrs; no Januvia this am    Nuclear Med Study 1 or 2 day study: 2 day  Stress Test Type:  Stress  Reading MD: Willa Rough, MD  Order Authorizing Provider:  Charlton Haws, MD  Resting Radionuclide: Technetium 47m Sestamibi  Resting Radionuclide Dose: 33.0 mCi on 09/14/12   Stress Radionuclide:  Technetium 29m Sestamibi  Stress Radionuclide Dose: 33.0 mCi on 09/13/12           Stress Protocol Rest HR: 54 Stress HR: 162  Rest BP: 99/73 Stress BP: 188/82  Exercise Time (min): 9:00 METS: 10.1   Predicted Max HR: 165 bpm % Max HR: 98.18 bpm Rate Pressure Product: 46962   Dose of Adenosine (mg):  n/a Dose of Lexiscan: n/a mg  Dose of Atropine (mg): n/a Dose of Dobutamine: n/a mcg/kg/min (at max HR)  Stress Test Technologist: Irean Hong, RN  Nuclear Technologist:  Domenic Polite, CNMT     Rest Procedure:  Myocardial perfusion imaging was performed at rest 45 minutes following the intravenous administration of Technetium 60m Sestamibi. Rest ECG: NSR with non-specific ST-T wave changes  Stress  Procedure:  The patient exercised on the treadmill utilizing the Bruce Protocol for 9 minutes, RPE=17. The patient stopped due to DOE and denied any chest pain.  Technetium 29m Sestamibi was injected at peak exercise and myocardial perfusion imaging was performed after a brief delay. Stress ECG: At peak stress mild ST depression in inferolateral leads.  QPS Raw Data Images:  Normal; no motion artifact; normal heart/lung ratio. Stress Images:  Normal homogeneous uptake in all areas of the myocardium. Rest Images:  Normal homogeneous uptake in all areas of the myocardium. Subtraction (SDS):  No evidence of ischemia. Transient Ischemic Dilatation (Normal <1.22):  1.05 Lung/Heart Ratio (Normal <0.45):  0.33  Quantitative Gated Spect Images QGS EDV:  79 ml QGS ESV:  18 ml  Impression Exercise Capacity:  Good exercise capacity. BP Response:  Normal blood pressure response. Clinical Symptoms:  There is dyspnea. ECG Impression:  Mild inferolateral ST depression at peak stress consistent with false positive EKG response to exercise. Comparison with Prior Nuclear Study: No previous nuclear study performed  Overall Impression:  Normal stress nuclear study.  LV Ejection Fraction: 77%.  LV Wall Motion:  NL LV Function; NL Wall Motion   [employer]

## 2012-09-13 NOTE — Progress Notes (Signed)
Echocardiogram performed.  

## 2012-09-14 ENCOUNTER — Ambulatory Visit (HOSPITAL_COMMUNITY): Payer: Managed Care, Other (non HMO) | Attending: Cardiology | Admitting: Radiology

## 2012-09-14 DIAGNOSIS — R0989 Other specified symptoms and signs involving the circulatory and respiratory systems: Secondary | ICD-10-CM

## 2012-09-14 MED ORDER — TECHNETIUM TC 99M SESTAMIBI GENERIC - CARDIOLITE
33.0000 | Freq: Once | INTRAVENOUS | Status: AC | PRN
  Administered 2012-09-14: 33 via INTRAVENOUS

## 2012-09-18 ENCOUNTER — Telehealth: Payer: Self-pay | Admitting: Cardiovascular Disease

## 2012-09-18 NOTE — Telephone Encounter (Signed)
New Problem:    Patient called in wanting to speak with you again about her ECHO results that were discussed on 09/15/12.  Please call back.

## 2012-09-18 NOTE — Telephone Encounter (Signed)
PT AWARE OF MYOVIEW RESULTS./CY 

## 2013-03-26 ENCOUNTER — Other Ambulatory Visit: Payer: Self-pay | Admitting: Obstetrics and Gynecology

## 2013-03-26 DIAGNOSIS — Z1231 Encounter for screening mammogram for malignant neoplasm of breast: Secondary | ICD-10-CM

## 2013-04-02 ENCOUNTER — Ambulatory Visit (HOSPITAL_COMMUNITY)
Admission: RE | Admit: 2013-04-02 | Discharge: 2013-04-02 | Disposition: A | Discharge status: Home / Self Care | Payer: Managed Care, Other (non HMO) | Source: Ambulatory Visit | Attending: Obstetrics and Gynecology | Admitting: Obstetrics and Gynecology

## 2013-04-02 DIAGNOSIS — Z1231 Encounter for screening mammogram for malignant neoplasm of breast: Secondary | ICD-10-CM

## 2013-04-26 ENCOUNTER — Other Ambulatory Visit: Payer: Self-pay | Admitting: *Deleted

## 2013-04-26 DIAGNOSIS — I83893 Varicose veins of bilateral lower extremities with other complications: Secondary | ICD-10-CM

## 2013-04-27 ENCOUNTER — Encounter: Payer: Self-pay | Admitting: Vascular Surgery

## 2013-04-30 ENCOUNTER — Ambulatory Visit (HOSPITAL_COMMUNITY)
Admission: RE | Admit: 2013-04-30 | Discharge: 2013-04-30 | Disposition: A | Discharge status: Home / Self Care | Payer: Managed Care, Other (non HMO) | Source: Ambulatory Visit | Attending: Vascular Surgery | Admitting: Vascular Surgery

## 2013-04-30 ENCOUNTER — Encounter (INDEPENDENT_AMBULATORY_CARE_PROVIDER_SITE_OTHER): Payer: Self-pay

## 2013-04-30 ENCOUNTER — Ambulatory Visit (INDEPENDENT_AMBULATORY_CARE_PROVIDER_SITE_OTHER): Payer: Managed Care, Other (non HMO) | Admitting: Vascular Surgery

## 2013-04-30 ENCOUNTER — Encounter: Payer: Self-pay | Admitting: Vascular Surgery

## 2013-04-30 VITALS — BP 138/89 | HR 69 | Resp 16 | Ht 66.0 in | Wt 250.0 lb

## 2013-04-30 DIAGNOSIS — I83893 Varicose veins of bilateral lower extremities with other complications: Secondary | ICD-10-CM

## 2013-04-30 DIAGNOSIS — I781 Nevus, non-neoplastic: Secondary | ICD-10-CM | POA: Insufficient documentation

## 2013-04-30 NOTE — Progress Notes (Signed)
Subjective:     Patient ID: Amber Adams, female   DOB: 03-23-1956, 57 y.o.   MRN: [id number]  HPI this 57 year old female was referred for evaluation of possible venous insufficiency of both legs. She has noted some spider veins in both lateral thigh areas right worse than left over the last few years. She has been having an aching discomfort along the lateral aspect of her right thigh which sometimes occurs while standing and sitting and sometimes at rest. She has no history of DVT, thrombophlebitis, stasis ulcers, bleeding, or bulging varicosities. She does not wear elastic compression stockings. She does have a bulging disc and has had some injection therapy for this which has been unsuccessful to date. She she thinks that this may be causing some of her leg pain.  Past Medical History  Diagnosis Date  . H/O ulcerative colitis 1986  . H/O hemorrhoids   . Abnormal Pap smear 1981  . Hypertension   . Diabetes mellitus   . PMB (postmenopausal bleeding) 2009  . Yeast infection 1997  . Urinary incontinence 1998  . Fibroid   . SUI (stress urinary incontinence), female 2003  . H/O amenorrhea 2003  . Endometrial polyp 2009  . Apnea, sleep 01/06/11  . Osteopenia   . Low back pain     bulging disc-cortisone injections    History  Substance Use Topics  . Smoking status: Never Smoker   . Smokeless tobacco: Never Used  . Alcohol Use: Yes     Comment: rare    Family History  Problem Relation Age of Onset  . Hypertension Maternal Uncle   . Heart disease Maternal Uncle     MI  . Vision loss Paternal Uncle   . Diabetes Maternal Grandmother   . Cancer Maternal Grandmother     Ovarian  . Cancer Mother     leiomyosarcoma    Allergies  Allergen Reactions  . Asa [Aspirin] Hives and Itching  . Beeswax Hives  . Lialda [Mesalamine] Hives and Itching  . Sulfa Drugs Cross Reactors Rash    Current outpatient prescriptions:acetaminophen (TYLENOL) 500 MG tablet, 500 mg every 6 hours for  3 days, then as needed. For mild to moderate pain, Disp: 60 tablet, Rfl: 0;  Colesevelam HCl (WELCHOL) 3.75 G PACK, Take 1 packet by mouth daily., Disp: , Rfl: ;  fish oil-omega-3 fatty acids 1000 MG capsule, Take 1 g by mouth daily., Disp: , Rfl: ;  Multiple Vitamin (MULTIVITAMIN WITH MINERALS) TABS, Take 1 tablet by mouth daily., Disp: , Rfl:  nebivolol (BYSTOLIC) 5 MG tablet, Take 5 mg by mouth daily., Disp: , Rfl: ;  oxyCODONE-acetaminophen (PERCOCET) 5-325 MG per tablet, Take 1 tablet by mouth every 4 (four) hours as needed for pain., Disp: 30 tablet, Rfl: 0;  sitaGLIPtin (JANUVIA) 100 MG tablet, Take 100 mg by mouth daily., Disp: , Rfl: ;  valsartan-hydrochlorothiazide (DIOVAN-HCT) 160-25 MG per tablet, Take 1 tablet by mouth daily., Disp: , Rfl:   BP 138/89  Pulse 69  Resp 16  Ht 5\' 6"  (1.676 m)  Wt 250 lb (113.399 kg)  BMI 40.37 kg/m2  LMP 07/12/2012  Body mass index is 40.37 kg/(m^2).           Review of Systems does have history of type 2 diabetes mellitus, back pain, leg pain with walking. Denies chest pain, dyspnea on exertion, PND, orthopnea, hemoptysis.     Objective:   Physical Exam BP 138/89  Pulse 69  Resp 16  Ht 5'  6" (1.676 m)  Wt 250 lb (113.399 kg)  BMI 40.37 kg/m2  LMP 07/12/2012  Gen.-alert and oriented x3 in no apparent distress HEENT normal for age Lungs no rhonchi or wheezing Cardiovascular regular rhythm no murmurs carotid pulses 3+ palpable no bruits audible Abdomen soft nontender no palpable masses Musculoskeletal free of  major deformities Skin clear -no rashes Neurologic normal Lower extremities 3+ femoral and dorsalis pedis pulses palpable bilaterally with no edema She has prominent spider veins in both lateral thigh areas distally right worse than left and some in the medial proximal calf bilaterally right worse than left. There is no hyperpigmentation, ulceration, bulging varicosities, or significant distal edema.  Today I ordered  bilateral venous duplex exam which I reviewed and interpreted. There is no significant reflux in the superficial venous system bilaterally and there is no DVT or deep venous reflux       Assessment:     Bilateral spider veins with no significant superficial or deep venous reflux noted on duplex scan Right lateral thigh pain-possibly due to 2 bulging lumbar disc    Plan:     Treatment for spider veins would be foam sclerotherapy if patient should desire this. I have not told her that this would relieve her leg symptoms. She is not interested in this at the present time and will followup with her back Dr.

## 2014-03-15 ENCOUNTER — Other Ambulatory Visit (HOSPITAL_COMMUNITY): Payer: Self-pay | Admitting: Obstetrics and Gynecology

## 2014-03-15 DIAGNOSIS — Z1231 Encounter for screening mammogram for malignant neoplasm of breast: Secondary | ICD-10-CM

## 2014-04-04 ENCOUNTER — Ambulatory Visit (HOSPITAL_COMMUNITY)
Admission: RE | Admit: 2014-04-04 | Discharge: 2014-04-04 | Disposition: A | Discharge status: Home / Self Care | Payer: Managed Care, Other (non HMO) | Source: Ambulatory Visit | Attending: Obstetrics and Gynecology | Admitting: Obstetrics and Gynecology

## 2014-04-04 DIAGNOSIS — Z1231 Encounter for screening mammogram for malignant neoplasm of breast: Secondary | ICD-10-CM | POA: Diagnosis present

## 2014-04-05 ENCOUNTER — Other Ambulatory Visit: Payer: Self-pay | Admitting: Obstetrics and Gynecology

## 2014-04-05 DIAGNOSIS — R928 Other abnormal and inconclusive findings on diagnostic imaging of breast: Secondary | ICD-10-CM

## 2014-04-12 ENCOUNTER — Ambulatory Visit
Admission: RE | Admit: 2014-04-12 | Discharge: 2014-04-12 | Disposition: A | Discharge status: Home / Self Care | Payer: Managed Care, Other (non HMO) | Source: Ambulatory Visit | Attending: Obstetrics and Gynecology | Admitting: Obstetrics and Gynecology

## 2014-04-12 DIAGNOSIS — R928 Other abnormal and inconclusive findings on diagnostic imaging of breast: Secondary | ICD-10-CM

## 2014-04-22 ENCOUNTER — Encounter: Payer: Self-pay | Admitting: Vascular Surgery

## 2014-11-08 IMAGING — US US BREAST*R* LIMITED INC AXILLA
1 series · 6 of 6 positions shown · non-contrast
Comparison: Previous exams.

CLINICAL DATA: Screening recall for a possible right breast mass.

EXAM:
ULTRASOUND OF THE RIGHT BREAST

[Series 1: superficial breast · 6 of 6 slices shown]
[im 1/6]
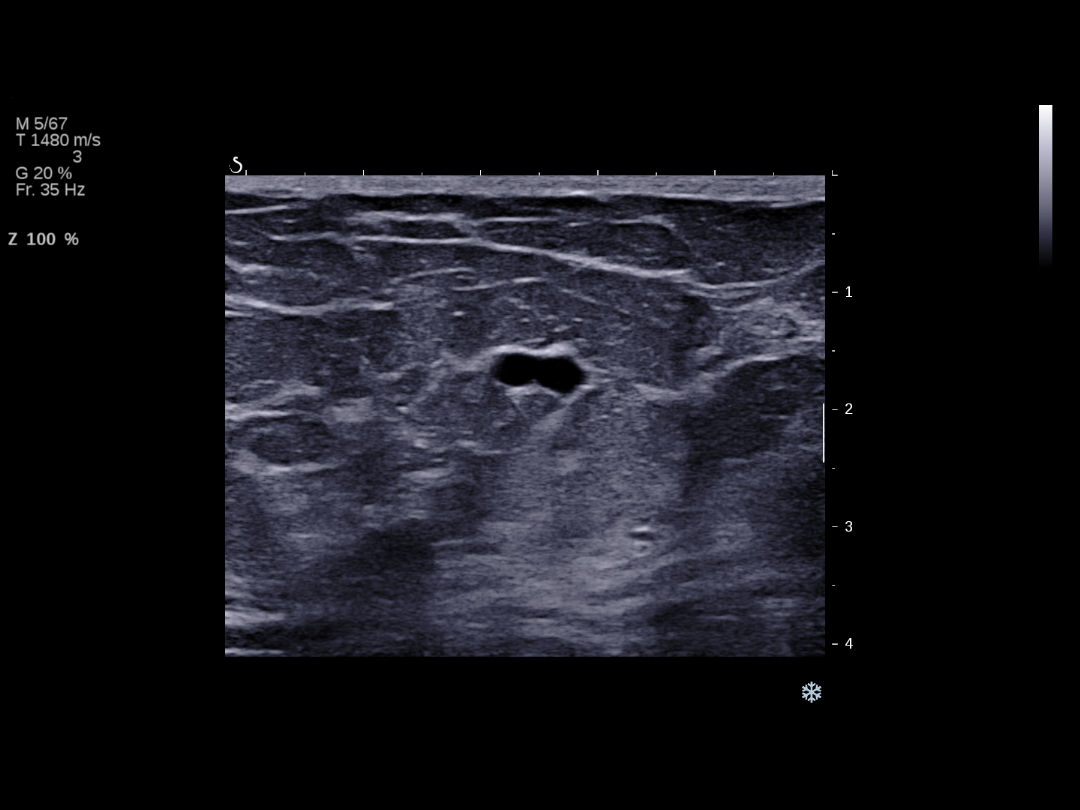
[im 2/6]
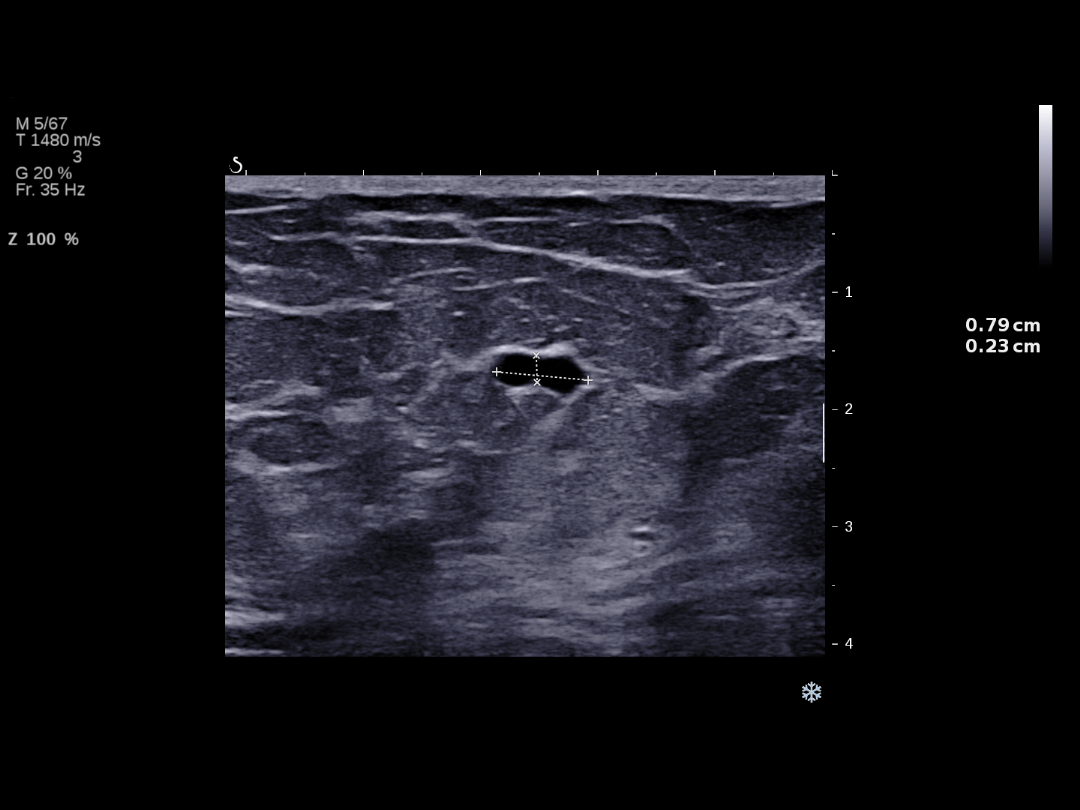
[im 3/6]
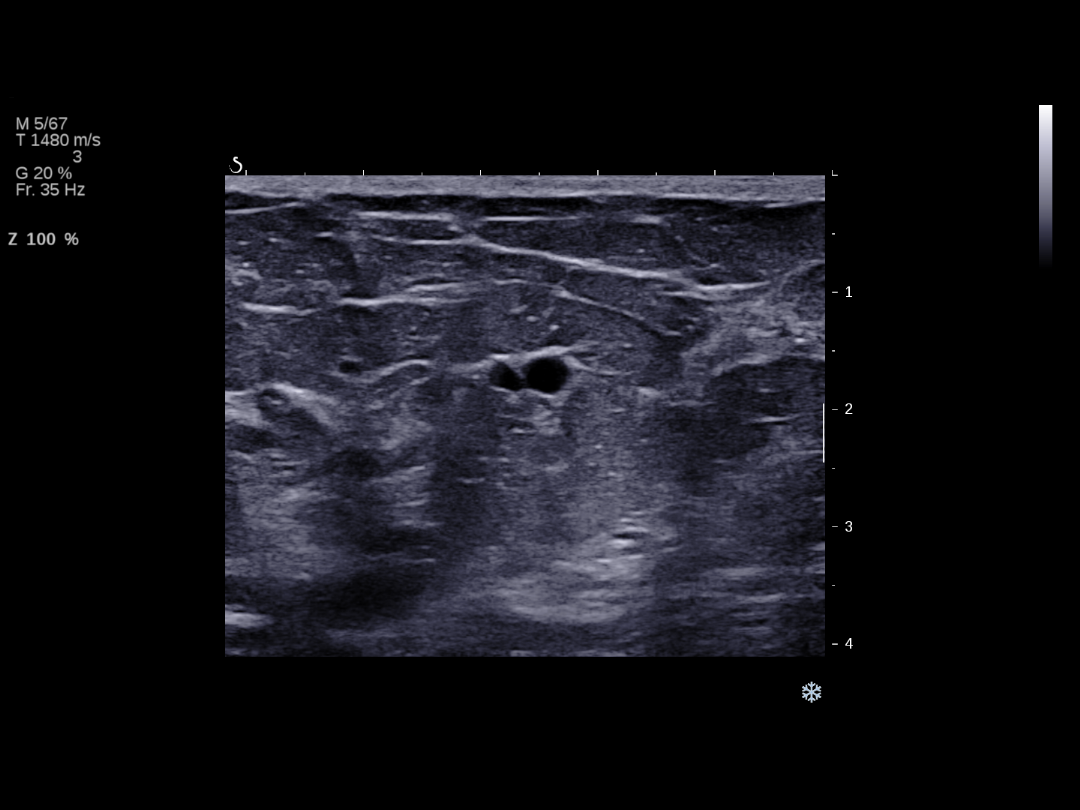
[im 4/6]
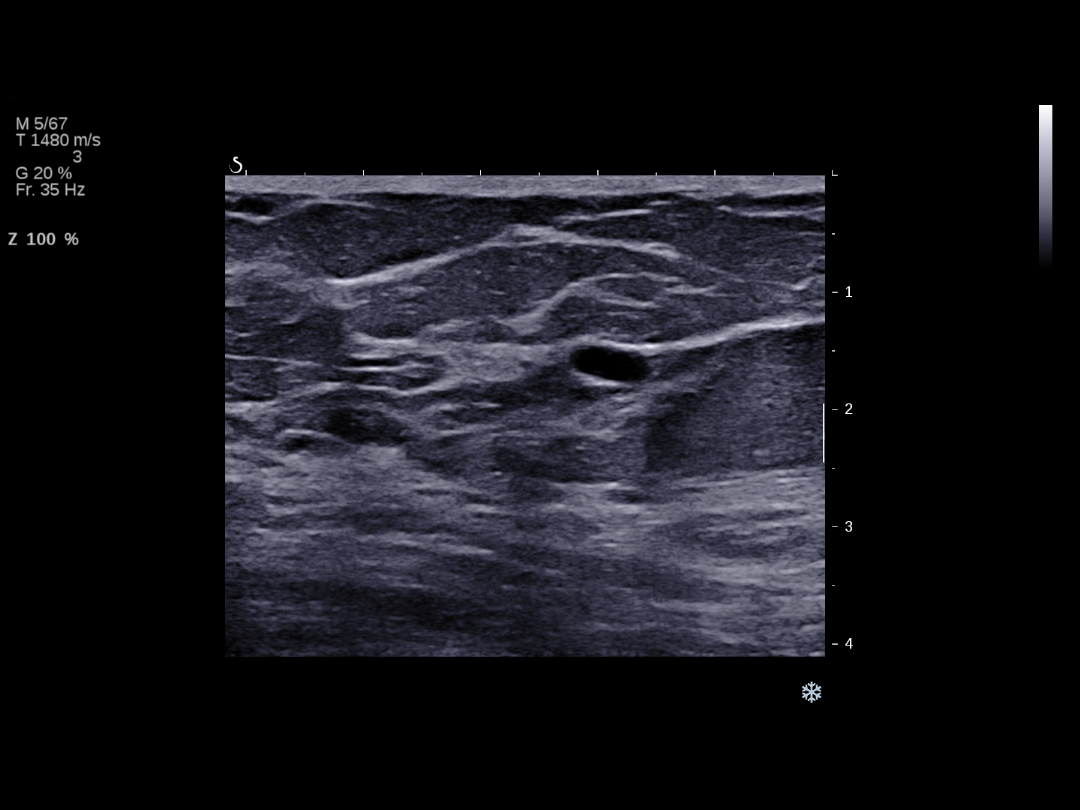
[im 5/6]
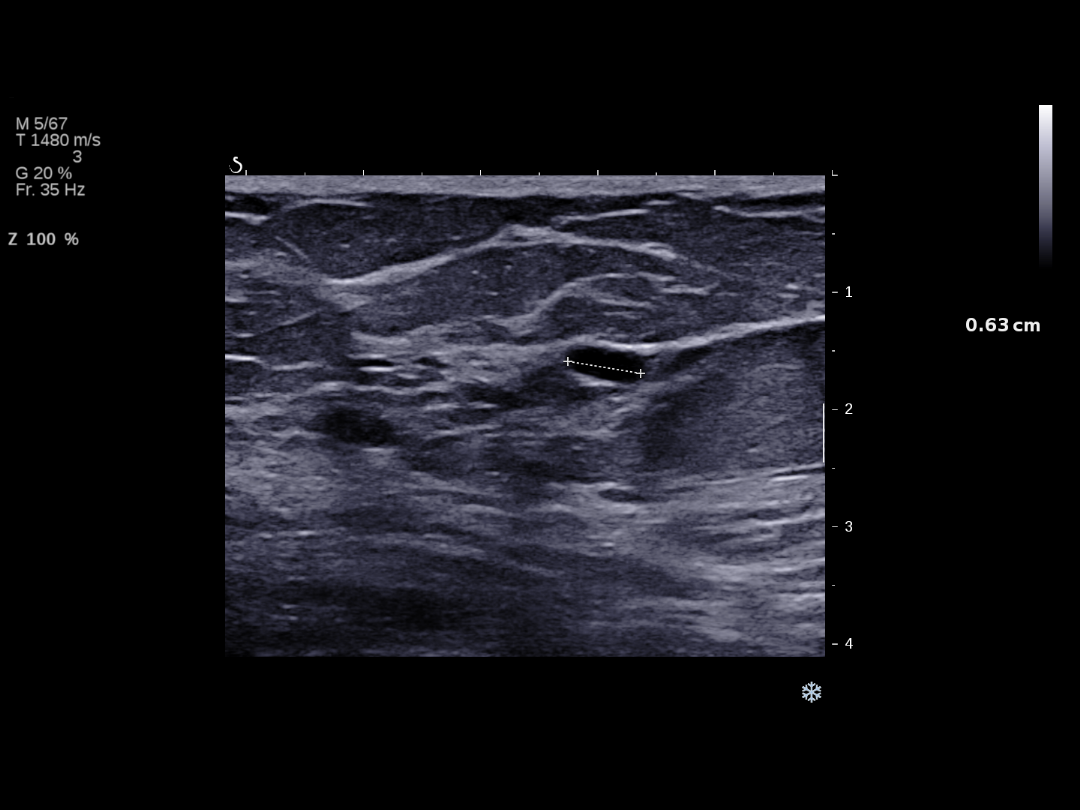
[im 6/6]
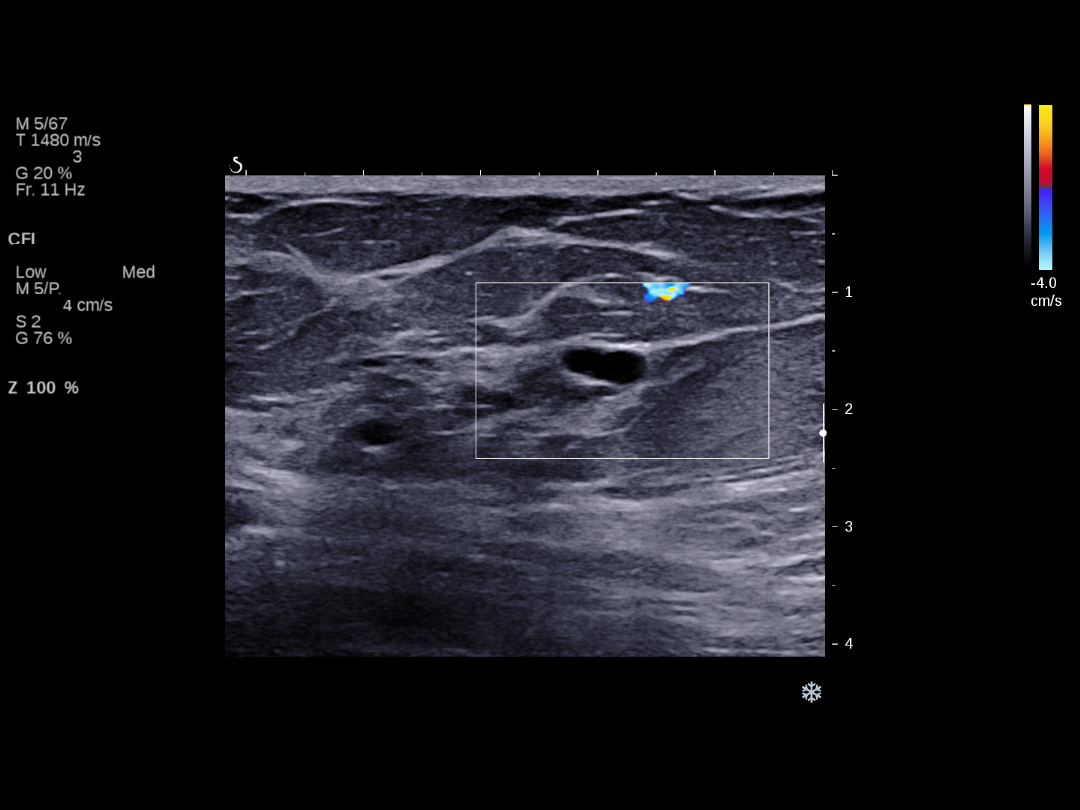

[6 of 6 positions shown; findings below may reference images not displayed]

FINDINGS: Physical examination of the outer right breast is not reveal any
palpable masses.

Targeted ultrasound of the right breast was performed demonstrating
an oval well-circumscribed anechoic mass at 9 o'clock 6 cm from the
nipple which either represents a lobulated cyst versus 2 separate
adjacent cysts. This measures 0.8 x 0.2 x 0.6 cm. This corresponds
with mammography findings.
IMPRESSION: Right breast cyst. There is no mammographic evidence of malignancy
in the right breast.

RECOMMENDATION:
Screening mammogram in one year.(Code:T2-G-WIX)

I have discussed the findings and recommendations with the patient.
Results were also provided in writing at the conclusion of the
visit. If applicable, a reminder letter will be sent to the patient
regarding the next appointment.

BI-RADS CATEGORY  2: Benign.

## 2015-02-12 ENCOUNTER — Other Ambulatory Visit: Payer: Self-pay

## 2016-08-17 DIAGNOSIS — E785 Hyperlipidemia, unspecified: Secondary | ICD-10-CM | POA: Diagnosis not present

## 2016-08-17 DIAGNOSIS — Z0000 Encounter for general adult medical examination without abnormal findings: Secondary | ICD-10-CM | POA: Diagnosis not present

## 2016-08-17 DIAGNOSIS — I10 Essential (primary) hypertension: Secondary | ICD-10-CM | POA: Diagnosis not present

## 2016-08-17 DIAGNOSIS — E1121 Type 2 diabetes mellitus with diabetic nephropathy: Secondary | ICD-10-CM | POA: Diagnosis not present

## 2016-08-24 DIAGNOSIS — E1121 Type 2 diabetes mellitus with diabetic nephropathy: Secondary | ICD-10-CM | POA: Diagnosis not present

## 2016-08-24 DIAGNOSIS — G4733 Obstructive sleep apnea (adult) (pediatric): Secondary | ICD-10-CM | POA: Diagnosis not present

## 2016-08-24 DIAGNOSIS — E785 Hyperlipidemia, unspecified: Secondary | ICD-10-CM | POA: Diagnosis not present

## 2016-09-23 ENCOUNTER — Encounter: Payer: 59 | Attending: Internal Medicine | Admitting: *Deleted

## 2016-09-23 DIAGNOSIS — E119 Type 2 diabetes mellitus without complications: Secondary | ICD-10-CM | POA: Insufficient documentation

## 2016-09-23 DIAGNOSIS — E1121 Type 2 diabetes mellitus with diabetic nephropathy: Secondary | ICD-10-CM

## 2016-09-23 NOTE — Patient Instructions (Addendum)
Plan:  Aim for 2-3 Carb Choices per meal (30-45 grams)   Aim for 0-1 Carbs per snack if hungry  Include protein in moderation with your meals and snacks Consider reading food labels for Total Carbohydrate of foods Consider  increasing your activity level daily as tolerated Consider checking BG at alternate times per day including after exercise and after meals occasionally Continue taking medication as directed by MD

## 2016-09-27 NOTE — Progress Notes (Signed)
Diabetes Self-Management Education  Visit Type: First/Initial  Appt. Start Time: 0930 Appt. End Time: 1100  09/27/2016  [name], identified by name and date of birth, is a 61 y.o. female with a diagnosis of Diabetes: Type 2. She states she has had Diabetes for several years but understands her A1c has increased lately. She is using [employer] currenltly and is pleased with it so far.   ASSESSMENT  Height 5\' 6"  (1.676 m), weight 255 lb 6.4 oz (115.8 kg), last menstrual period 07/12/2012. Body mass index is 41.22 kg/m.      Diabetes Self-Management Education - 09/23/16 0943      Visit Information   Visit Type First/Initial     Initial Visit   Diabetes Type Type 2   Are you currently following a meal plan? Yes   What type of meal plan do you follow? [employer]    Are you taking your medications as prescribed? Yes     Health Coping   How would you rate your overall health? Fair     Psychosocial Assessment   Patient Belief/Attitude about Diabetes Motivated to manage diabetes   Self-care barriers None   Self-management support --  [employer]   Other persons present Patient   Patient Concerns Nutrition/Meal planning;Glycemic Control;Weight Control   How often do you need to have someone help you when you read instructions, pamphlets, or other written materials from your doctor or pharmacy? 1 - Never   What is the last grade level you completed in school? 14th      Pre-Education Assessment   Patient understands the diabetes disease and treatment process. Needs Instruction   Patient understands incorporating nutritional management into lifestyle. Needs Review   Patient undertands incorporating physical activity into lifestyle. Needs Instruction   Patient understands using medications safely. Needs Instruction   Patient understands monitoring blood glucose, interpreting and using results Needs Review   Patient understands prevention, detection, and  treatment of acute complications. Needs Review   Patient understands prevention, detection, and treatment of chronic complications. Needs Review     Complications   Last HgB A1C per patient/outside source 7 %   How often do you check your blood sugar? 1-2 times/day   Fasting Blood glucose range (mg/dL) 130-179   Number of hypoglycemic episodes per month 0   Have you had a dilated eye exam in the past 12 months? Yes   Have you had a dental exam in the past 12 months? Yes     Dietary Intake   Breakfast 40 cal wheat bread x 2 slices, [place] sausage, 2 eggs, strawberry preserves   Snack (morning) fresh fruit   Lunch grilled chicken in a salad with lite dressing, fresh fruit   Snack (afternoon) mixed nuts   Dinner seafood or lean meat, is limiting starches lately except corn or peas, enjoys other vegetables or salad   Snack (evening) fresh fruit, nuts, or [employer] popsicle   Beverage(s) coffee with Stevia and SF flavored creamer, water, Crystal Light occasionally, Diet Dr. Malachi Bonds occasionally, wine maybe less than once a week     Exercise   Exercise Type ADL's  limited due to herniated disk and pinched nerve     Patient Education   Previous Diabetes Education Yes (please comment)   Disease state  Definition of diabetes, type 1 and 2, and the diagnosis of diabetes   Nutrition management  Role of diet in the treatment of diabetes and the relationship between the three  main macronutrients and blood glucose level;Food label reading, portion sizes and measuring food.;Carbohydrate counting   Physical activity and exercise  Role of exercise on diabetes management, blood pressure control and cardiac health.   Medications Reviewed patients medication for diabetes, action, purpose, timing of dose and side effects.   Monitoring Purpose and frequency of SMBG.;Identified appropriate SMBG and/or A1C goals.   Chronic complications Relationship between chronic complications and blood glucose control    Psychosocial adjustment Role of stress on diabetes     Individualized Goals (developed by patient)   Nutrition Follow meal plan discussed   Physical Activity Exercise 3-5 times per week   Monitoring  test blood glucose pre and post meals as discussed     Post-Education Assessment   Patient understands the diabetes disease and treatment process. Demonstrates understanding / competency   Patient understands incorporating nutritional management into lifestyle. Demonstrates understanding / competency   Patient understands using medications safely. Demonstrates understanding / competency   Patient understands monitoring blood glucose, interpreting and using results Demonstrates understanding / competency   Patient understands prevention, detection, and treatment of acute complications. Demonstrates understanding / competency   Patient understands prevention, detection, and treatment of chronic complications. Demonstrates understanding / competency     Outcomes   Expected Outcomes Demonstrated interest in learning. Expect positive outcomes   Future DMSE PRN   Program Status Completed      Individualized Plan for Diabetes Self-Management Training:   Learning Objective:  Patient will have a greater understanding of diabetes self-management. Patient education plan is to attend individual and/or group sessions per assessed needs and concerns.   Plan:   Patient Instructions  Plan:  Aim for 2-3 Carb Choices per meal (30-45 grams)   Aim for 0-1 Carbs per snack if hungry  Include protein in moderation with your meals and snacks Consider reading food labels for Total Carbohydrate of foods Consider  increasing your activity level daily as tolerated Consider checking BG at alternate times per day including after exercise and after meals occasionally Continue taking medication as directed by MD  Expected Outcomes:  Demonstrated interest in learning. Expect positive outcomes  Education  material provided: Living Well with Diabetes, A1C conversion sheet, Meal plan card and Carbohydrate counting sheet  If problems or questions, patient to contact team via:  Phone and Email  Future DSME appointment: PRN

## 2016-12-06 DIAGNOSIS — E784 Other hyperlipidemia: Secondary | ICD-10-CM | POA: Diagnosis not present

## 2016-12-08 DIAGNOSIS — I10 Essential (primary) hypertension: Secondary | ICD-10-CM | POA: Diagnosis not present

## 2016-12-08 DIAGNOSIS — E1121 Type 2 diabetes mellitus with diabetic nephropathy: Secondary | ICD-10-CM | POA: Diagnosis not present

## 2016-12-08 DIAGNOSIS — G4733 Obstructive sleep apnea (adult) (pediatric): Secondary | ICD-10-CM | POA: Diagnosis not present

## 2017-01-04 DIAGNOSIS — Z1211 Encounter for screening for malignant neoplasm of colon: Secondary | ICD-10-CM | POA: Diagnosis not present

## 2017-02-02 DIAGNOSIS — K635 Polyp of colon: Secondary | ICD-10-CM | POA: Diagnosis not present

## 2017-02-02 DIAGNOSIS — Z1211 Encounter for screening for malignant neoplasm of colon: Secondary | ICD-10-CM | POA: Diagnosis not present

## 2017-02-02 DIAGNOSIS — D127 Benign neoplasm of rectosigmoid junction: Secondary | ICD-10-CM | POA: Diagnosis not present

## 2017-02-02 DIAGNOSIS — D124 Benign neoplasm of descending colon: Secondary | ICD-10-CM | POA: Diagnosis not present

## 2017-02-02 DIAGNOSIS — K529 Noninfective gastroenteritis and colitis, unspecified: Secondary | ICD-10-CM | POA: Diagnosis not present

## 2017-03-01 DIAGNOSIS — G4733 Obstructive sleep apnea (adult) (pediatric): Secondary | ICD-10-CM | POA: Diagnosis not present

## 2017-04-20 DIAGNOSIS — H43813 Vitreous degeneration, bilateral: Secondary | ICD-10-CM | POA: Diagnosis not present

## 2017-04-20 DIAGNOSIS — E119 Type 2 diabetes mellitus without complications: Secondary | ICD-10-CM | POA: Diagnosis not present

## 2017-04-28 DIAGNOSIS — Z01419 Encounter for gynecological examination (general) (routine) without abnormal findings: Secondary | ICD-10-CM | POA: Diagnosis not present

## 2017-04-28 DIAGNOSIS — Z1231 Encounter for screening mammogram for malignant neoplasm of breast: Secondary | ICD-10-CM | POA: Diagnosis not present

## 2017-05-06 DIAGNOSIS — E1121 Type 2 diabetes mellitus with diabetic nephropathy: Secondary | ICD-10-CM | POA: Diagnosis not present

## 2017-05-09 DIAGNOSIS — Z23 Encounter for immunization: Secondary | ICD-10-CM | POA: Diagnosis not present

## 2017-05-09 DIAGNOSIS — E1121 Type 2 diabetes mellitus with diabetic nephropathy: Secondary | ICD-10-CM | POA: Diagnosis not present

## 2017-05-09 DIAGNOSIS — I10 Essential (primary) hypertension: Secondary | ICD-10-CM | POA: Diagnosis not present

## 2017-07-25 ENCOUNTER — Encounter (HOSPITAL_COMMUNITY): Payer: Self-pay | Admitting: *Deleted

## 2017-07-25 ENCOUNTER — Emergency Department (HOSPITAL_COMMUNITY): Payer: 59

## 2017-07-25 ENCOUNTER — Other Ambulatory Visit: Payer: Self-pay

## 2017-07-25 ENCOUNTER — Emergency Department (HOSPITAL_COMMUNITY)
Admission: EM | Admit: 2017-07-25 | Discharge: 2017-07-25 | Disposition: A | Discharge status: Home / Self Care | Payer: 59 | Attending: Emergency Medicine | Admitting: Emergency Medicine

## 2017-07-25 DIAGNOSIS — R197 Diarrhea, unspecified: Secondary | ICD-10-CM | POA: Insufficient documentation

## 2017-07-25 DIAGNOSIS — R42 Dizziness and giddiness: Secondary | ICD-10-CM

## 2017-07-25 DIAGNOSIS — Z79899 Other long term (current) drug therapy: Secondary | ICD-10-CM | POA: Insufficient documentation

## 2017-07-25 DIAGNOSIS — I10 Essential (primary) hypertension: Secondary | ICD-10-CM | POA: Diagnosis not present

## 2017-07-25 DIAGNOSIS — Z7984 Long term (current) use of oral hypoglycemic drugs: Secondary | ICD-10-CM | POA: Insufficient documentation

## 2017-07-25 DIAGNOSIS — E119 Type 2 diabetes mellitus without complications: Secondary | ICD-10-CM | POA: Insufficient documentation

## 2017-07-25 DIAGNOSIS — R112 Nausea with vomiting, unspecified: Secondary | ICD-10-CM

## 2017-07-25 DIAGNOSIS — R404 Transient alteration of awareness: Secondary | ICD-10-CM | POA: Diagnosis not present

## 2017-07-25 LAB — URINALYSIS, ROUTINE W REFLEX MICROSCOPIC
Bacteria, UA: NONE SEEN
Bilirubin Urine: NEGATIVE
Hgb urine dipstick: NEGATIVE
Ketones, ur: NEGATIVE mg/dL
Leukocytes, UA: NEGATIVE
Nitrite: NEGATIVE
PROTEIN: NEGATIVE mg/dL
Specific Gravity, Urine: 1.01 (ref 1.005–1.030)
pH: 7 (ref 5.0–8.0)

## 2017-07-25 LAB — COMPREHENSIVE METABOLIC PANEL
ALBUMIN: 3.7 g/dL (ref 3.5–5.0)
ALT: 20 U/L (ref 14–54)
AST: 31 U/L (ref 15–41)
Alkaline Phosphatase: 74 U/L (ref 38–126)
Anion gap: 15 (ref 5–15)
BUN: 16 mg/dL (ref 6–20)
CHLORIDE: 102 mmol/L (ref 101–111)
CO2: 20 mmol/L — AB (ref 22–32)
CREATININE: 1 mg/dL (ref 0.44–1.00)
Calcium: 9.4 mg/dL (ref 8.9–10.3)
GFR calc non Af Amer: 60 mL/min — ABNORMAL LOW (ref >60)
Glucose, Bld: 242 mg/dL — ABNORMAL HIGH (ref 65–99)
Potassium: 4.3 mmol/L (ref 3.5–5.1)
SODIUM: 137 mmol/L (ref 135–145)
Total Bilirubin: 0.9 mg/dL (ref 0.3–1.2)
Total Protein: 8.3 g/dL — ABNORMAL HIGH (ref 6.5–8.1)

## 2017-07-25 LAB — CBG MONITORING, ED: Glucose-Capillary: 182 mg/dL — ABNORMAL HIGH (ref 65–99)

## 2017-07-25 LAB — CBC
HCT: 39.4 % (ref 36.0–46.0)
Hemoglobin: 13.3 g/dL (ref 12.0–15.0)
MCH: 32 pg (ref 26.0–34.0)
MCHC: 33.8 g/dL (ref 30.0–36.0)
MCV: 94.7 fL (ref 78.0–100.0)
PLATELETS: 233 10*3/uL (ref 150–400)
RBC: 4.16 MIL/uL (ref 3.87–5.11)
RDW: 14.1 % (ref 11.5–15.5)
WBC: 9.5 10*3/uL (ref 4.0–10.5)

## 2017-07-25 LAB — INFLUENZA PANEL BY PCR (TYPE A & B)
Influenza A By PCR: NEGATIVE
Influenza B By PCR: NEGATIVE

## 2017-07-25 MED ORDER — MECLIZINE HCL 12.5 MG PO TABS: 12.5000 mg | ORAL_TABLET | Freq: Three times a day (TID) | ORAL | 0 refills | Status: DC | PRN

## 2017-07-25 MED ORDER — MECLIZINE HCL 25 MG PO TABS
12.5000 mg | ORAL_TABLET | Freq: Once | ORAL | Status: AC
  Administered 2017-07-25: 12.5 mg via ORAL
  Filled 2017-07-25: qty 1

## 2017-07-25 MED ORDER — SODIUM CHLORIDE 0.9 % IV BOLUS (SEPSIS): 1000.0000 mL | Freq: Once | INTRAVENOUS | Status: DC

## 2017-07-25 MED ORDER — ONDANSETRON HCL 4 MG/2ML IJ SOLN
4.0000 mg | Freq: Once | INTRAMUSCULAR | Status: AC
  Administered 2017-07-25: 4 mg via INTRAVENOUS
  Filled 2017-07-25: qty 2

## 2017-07-25 MED ORDER — SODIUM CHLORIDE 0.9 % IV BOLUS (SEPSIS)
500.0000 mL | Freq: Once | INTRAVENOUS | Status: AC
Start: 2017-07-25 — End: 2017-07-25
  Administered 2017-07-25: 500 mL via INTRAVENOUS

## 2017-07-25 MED ORDER — SODIUM CHLORIDE 0.9 % IV BOLUS (SEPSIS)
1000.0000 mL | Freq: Once | INTRAVENOUS | Status: AC
  Administered 2017-07-25: 1000 mL via INTRAVENOUS

## 2017-07-25 MED ORDER — ONDANSETRON HCL 4 MG PO TABS: 4.0000 mg | ORAL_TABLET | Freq: Four times a day (QID) | ORAL | 0 refills | Status: AC

## 2017-07-25 NOTE — ED Notes (Signed)
Pt given water per Dr. Sherry Ruffing

## 2017-07-25 NOTE — Discharge Instructions (Signed)
Take 1-2 Antivert every 8 hours as needed for dizziness.  You can take Zofran every 6 hours as needed for nausea or vomiting.  Please follow-up with your doctor for further evaluation and treatment.  Please return to the emergency department if you develop any new or worsening symptoms including weakness or numbness of one side of your body, persisting vision changes, slurred speech, or any other new or concerning symptoms.

## 2017-07-25 NOTE — ED Triage Notes (Signed)
Pt in from home via [facility], per report pt woke up feeling dizzy and x1 episode of emesis, pt has elevated BS, pt given 300 mL NS, pt no facial droop and slurred speech, negative stroke screen in route, A&O x4

## 2017-07-25 NOTE — ED Provider Notes (Signed)
[name] EMERGENCY DEPARTMENT Provider Note   CSN: [id number] Arrival date & time: 07/25/17  4098     History   Chief Complaint Chief Complaint  Patient presents with  . Dizziness  . Emesis    HPI Amber Adams is a 62 y.o. female with history of hypertension, diabetes who presents with a 1 day history of vertigo with associated vomiting diarrhea.  Patient reports feeling fine before she went to bed last night.  She woke up this morning and looks at her fall.  She got up out of bed and began feeling room spinning dizziness.  It has continued since.  It is mostly only present when she opens her eyes.  Moving her head does not seem to change the sensation.  She reports eating a banana and vomited soon after that.  She reports her blood sugar was 200 at home.  She denies any pain.  She denies any cough, nasal congestion, or fevers.  She does report feeling clammy today.  She denies any chest pain, shortness of breath, or abdominal pain.  She did not take any medications prior to arrival.  She denies history of vertigo.  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  HPI  Past Medical History:  Diagnosis Date  . Abnormal Pap smear 1981  . Apnea, sleep 01/06/11  . Diabetes mellitus   . Endometrial polyp 2009  . Fibroid   . H/O amenorrhea 2003  . H/O hemorrhoids   . H/O ulcerative colitis 1986  . Hypertension   . Low back pain    bulging disc-cortisone injections  . Osteopenia   . PMB (postmenopausal bleeding) 2009  . SUI (stress urinary incontinence), female 2003  . Urinary incontinence 1998  . Yeast infection 1997    Patient Active Problem List   Diagnosis Date Noted  . Spider veins 04/30/2013  . Diabetes ([place]) 08/31/2012  . Preop cardiovascular exam 08/31/2012  . Postmenopausal bleeding 07/28/2012  . Osteopenia   . H/O ulcerative colitis   . H/O hemorrhoids   . Abnormal Pap smear   . Hypertension   . PMB  (postmenopausal bleeding)   . Anemia   . Yeast infection   . Urinary incontinence   . Fibroid   . SUI (stress urinary incontinence), female   . H/O amenorrhea   . Endometrial polyp   . Apnea, sleep 01/06/2011    Past Surgical History:  Procedure Laterality Date  . COMBINED HYSTEROSCOPY DIAGNOSTIC / D&C  2009   endometrial polyp and menopausal bleeding  . DILATATION & CURRETTAGE/HYSTEROSCOPY WITH RESECTOCOPE N/A 09/01/2012   Procedure: DILATATION & CURETTAGE/HYSTEROSCOPY WITH RESECTOCOPE;  Surgeon: Eldred Manges, MD;  Location: [facility];  Service: Gynecology;  Laterality: N/A;  . FOOT NEUROMA SURGERY Right 2000  . OVARIAN CYST REMOVAL  1979  . ovarian cystectomy Left 1995   dermoid    OB History    Gravida Para Term Preterm AB Living   2 2 2     2    SAB TAB Ectopic Multiple Live Births           2       Home Medications    Prior to Admission medications   Medication Sig Start Date End Date Taking? Authorizing Provider  acetaminophen (TYLENOL) 500 MG tablet 500 mg every 6 hours for 3 days, then as needed. For mild to moderate pain 09/01/12  Yes Haygood, Seymour Bars, MD  atorvastatin (LIPITOR) 20 MG tablet Take 20 mg by mouth daily.   Yes [provider]  Biotin 10000 MCG TABS Take 10,000 mcg by mouth daily.   Yes [provider]  cetirizine (ZYRTEC) 10 MG tablet Take 10 mg by mouth daily.   Yes [provider]  Coenzyme Q10 (COQ10) 100 MG CAPS Take 100 mg by mouth daily.   Yes [provider]  fish oil-omega-3 fatty acids 1000 MG capsule Take 1 g by mouth daily.   Yes [provider]  metFORMIN (GLUCOPHAGE-XR) 500 MG 24 hr tablet Take 1,000 mg by mouth daily with breakfast.    Yes [provider]  Multiple Vitamin (MULTIVITAMIN WITH MINERALS) TABS Take 1 tablet by mouth daily.   Yes [provider]  nebivolol (BYSTOLIC) 5 MG tablet Take 5 mg by mouth daily.   Yes [provider]  sitaGLIPtin (JANUVIA) 100  MG tablet Take 100 mg by mouth daily.   Yes [provider]  valsartan-hydrochlorothiazide (DIOVAN-HCT) 160-25 MG per tablet Take 1 tablet by mouth daily.   Yes [provider]  meclizine (ANTIVERT) 12.5 MG tablet Take 1 tablet (12.5 mg total) by mouth 3 (three) times daily as needed for dizziness. 07/25/17   Yomaira Solar, Bea Graff, PA-C  ondansetron (ZOFRAN) 4 MG tablet Take 1 tablet (4 mg total) by mouth every 6 (six) hours. 07/25/17   Adarryl Goldammer, Bea Graff, PA-C  oxyCODONE-acetaminophen (PERCOCET) 5-325 MG per tablet Take 1 tablet by mouth every 4 (four) hours as needed for pain. Patient not taking: Reported on 07/25/2017 09/01/12   Eldred Manges, MD    Family History Family History  Problem Relation Age of Onset  . Hypertension Maternal Uncle   . Heart disease Maternal Uncle        MI  . Vision loss Paternal Uncle   . Diabetes Maternal Grandmother   . Cancer Maternal Grandmother        Ovarian  . Cancer Mother        leiomyosarcoma    Social History Social History   Tobacco Use  . Smoking status: Never Smoker  . Smokeless tobacco: Never Used  Substance Use Topics  . Alcohol use: Yes    Comment: rare  . Drug use: No     Allergies   Asa [aspirin]; Beeswax; Lialda [mesalamine]; and Sulfa drugs cross reactors   Review of Systems Review of Systems  Constitutional: Negative for chills and fever.  HENT: Negative for facial swelling and sore throat.   Respiratory: Negative for shortness of breath.   Cardiovascular: Negative for chest pain.  Gastrointestinal: Positive for diarrhea, nausea and vomiting. Negative for abdominal pain and blood in stool.  Genitourinary: Negative for dysuria.  Musculoskeletal: Negative for back pain.  Skin: Negative for rash and wound.  Neurological: Positive for dizziness. Negative for headaches.  Psychiatric/Behavioral: The patient is not nervous/anxious.      Physical Exam Updated Vital Signs BP 125/79 (BP Location: Right Arm)    Pulse 70   Temp 97.8 F (36.6 C)   Ht 5\' 6"  (1.676 m)   Wt 117.9 kg (260 lb)   LMP 07/12/2012   SpO2 92%   BMI 41.97 kg/m   Physical Exam  Constitutional: She appears well-developed and well-nourished. No distress.  HENT:  Head: Normocephalic and atraumatic.  Mouth/Throat: Oropharynx is clear and moist. No oropharyngeal exudate.  Eyes: Conjunctivae and EOM are normal. Pupils are equal, round, and reactive to light. Right eye exhibits no discharge. Left  eye exhibits no discharge. No scleral icterus.  No obvious nystagmus  Neck: Normal range of motion. Neck supple. No thyromegaly present.  Cardiovascular: Normal rate, regular rhythm, normal heart sounds and intact distal pulses. Exam reveals no gallop and no friction rub.  No murmur heard. Pulmonary/Chest: Effort normal and breath sounds normal. No stridor. No respiratory distress. She has no wheezes. She has no rales.  Abdominal: Soft. Bowel sounds are normal. She exhibits no distension. There is no tenderness. There is no rebound and no guarding.  Musculoskeletal: She exhibits no edema.  Lymphadenopathy:    She has no cervical adenopathy.  Neurological: She is alert. Coordination normal.  CN 3-12 intact; normal sensation throughout; 5/5 strength in all 4 extremities; equal bilateral grip strength; no ataxia on finger to nose  Skin: Skin is warm and dry. No rash noted. She is not diaphoretic. No pallor.  Psychiatric: She has a normal mood and affect.  Nursing note and vitals reviewed.    ED Treatments / Results  Labs (all labs ordered are listed, but only abnormal results are displayed) Labs Reviewed  COMPREHENSIVE METABOLIC PANEL - Abnormal; Notable for the following components:      Result Value   CO2 20 (*)    Glucose, Bld 242 (*)    Total Protein 8.3 (*)    GFR calc non Af Amer 60 (*)    All other components within normal limits  URINALYSIS, ROUTINE W REFLEX MICROSCOPIC - Abnormal; Notable for the following components:     Color, Urine STRAW (*)    Glucose, UA >=500 (*)    Squamous Epithelial / LPF 0-5 (*)    All other components within normal limits  CBG MONITORING, ED - Abnormal; Notable for the following components:   Glucose-Capillary 182 (*)    All other components within normal limits  CBC  INFLUENZA PANEL BY PCR (TYPE A & B)    EKG  EKG Interpretation  Date/Time:  Monday July 25 2017 09:48:49 EST Ventricular Rate:  70 PR Interval:  166 QRS Duration: 86 QT Interval:  468 QTC Calculation: 505 R Axis:   11 Text Interpretation:  Normal sinus rhythm Septal infarct , age undetermined ST & T wave abnormality, consider lateral ischemia Prolonged QT Abnormal ECG when compared to prior, no significant changes seen.  No STEMI Confirmed by Antony Blackbird [phone]) on 07/25/2017 12:00:44 PM       Radiology Ct Head Wo Contrast  Result Date: 07/25/2017 CLINICAL DATA:  Dizziness. EXAM: CT HEAD WITHOUT CONTRAST TECHNIQUE: Contiguous axial images were obtained from the base of the skull through the vertex without intravenous contrast. COMPARISON:  None. FINDINGS: Brain: No evidence of acute infarction, hemorrhage, hydrocephalus, extra-axial collection or mass lesion/mass effect. Vascular: No hyperdense vessel or unexpected calcification. Skull: Normal. Negative for fracture or focal lesion. Sinuses/Orbits: No acute finding. Other: None. IMPRESSION: 1. Normal noncontrast head CT. Electronically Signed   By: Titus Dubin M.D.   On: 07/25/2017 10:29    Procedures Procedures (including critical care time)  Medications Ordered in ED Medications  sodium chloride 0.9 % bolus 1,000 mL (0 mLs Intravenous Stopped 07/25/17 0933)  ondansetron (ZOFRAN) injection 4 mg (4 mg Intravenous Given 07/25/17 1050)  meclizine (ANTIVERT) tablet 12.5 mg (12.5 mg Oral Given 07/25/17 1050)  sodium chloride 0.9 % bolus 500 mL (0 mLs Intravenous Stopped 07/25/17 1619)     Initial Impression / Assessment and Plan / ED Course  I have  reviewed the triage vital signs and the  nursing notes.  Pertinent labs & imaging results that were available during my care of the patient were reviewed by me and considered in my medical decision making (see chart for details).     Patient with history of diabetes, family history of vertigo who presents with suspected peripheral vertigo.  Labs unremarkable, except hyperglycemia (242).  Flu swab negative.  Patient given fluids in the ED and meclizine and is feeling much better.  She may have a degree dehydrated as well.  Orthostatics negative here.  Normal neuro exam without focal deficits.  Low suspicion of central etiology.  Will have patient follow-up with PCP for further evaluation and treatment.  Strict return precautions given.  Patient understands and agrees with plan.  Patient vitals stable throughout ED course and discharged in satisfactory condition.  Patient also evaluated by Dr. Sherry Ruffing who guided the patient's management and agrees with plan.   Final Clinical Impressions(s) / ED Diagnoses   Final diagnoses:  Vertigo  Nausea vomiting and diarrhea    ED Discharge Orders        Ordered    meclizine (ANTIVERT) 12.5 MG tablet  3 times daily PRN     07/25/17 1545    ondansetron (ZOFRAN) 4 MG tablet  Every 6 hours     07/25/17 1545       Arbie Blankley, [place], PA-C 07/25/17 1619    Tegeler, Gwenyth Allegra, MD 07/25/17 [phone]

## 2017-07-25 NOTE — ED Notes (Addendum)
Pt CBG was 182, notified Holley(RN)

## 2017-07-28 DIAGNOSIS — R42 Dizziness and giddiness: Secondary | ICD-10-CM | POA: Diagnosis not present

## 2017-07-28 DIAGNOSIS — R9431 Abnormal electrocardiogram [ECG] [EKG]: Secondary | ICD-10-CM | POA: Diagnosis not present

## 2017-07-28 DIAGNOSIS — E1121 Type 2 diabetes mellitus with diabetic nephropathy: Secondary | ICD-10-CM | POA: Diagnosis not present

## 2017-08-01 ENCOUNTER — Encounter: Payer: Self-pay | Admitting: Cardiovascular Disease

## 2017-08-01 ENCOUNTER — Ambulatory Visit (INDEPENDENT_AMBULATORY_CARE_PROVIDER_SITE_OTHER): Payer: 59 | Admitting: Cardiovascular Disease

## 2017-08-01 VITALS — BP 104/72 | HR 72 | Ht 66.0 in | Wt 264.8 lb

## 2017-08-01 DIAGNOSIS — R9431 Abnormal electrocardiogram [ECG] [EKG]: Secondary | ICD-10-CM | POA: Diagnosis not present

## 2017-08-01 NOTE — Progress Notes (Signed)
Cardiology Office Note:    Date:  08/01/2017   ID:  Amber Adams, DOB 07/04/1955, MRN [id number]  PCP:  Leeroy Cha, MD  Cardiologist:  No primary care provider on file.   Referring MD: Lucianne Lei, MD   Chief Complaint  Patient presents with  . Abnormal ECG     History of Present Illness:    Amber Adams is a 62 y.o. female with a hx of hypertension, diabetes mellitus, obesity. After we are asked to see her today by Dr. Ronie Spies for further evaluation of an abnormal ECG .  The patient was seen in our office in 2014 for an abnormal EKG and preoperative evaluation.  She is not having any signs or symptoms of ischemia.  Stress Myoview study was normal.  She had no evidence of ischemia and no evidence of previous infarction.  Echocardiogram revealed normal left ventricular systolic function.  Amber Adams had an episodes of dizziness and hyperglycemia ( 275)  Received 2 liters of NS.  Improved.   Was not admitted.  The ER staff thought she may have a viral illness ( she works in a day care)   Went to her primary care ,  Who thought she had a heart attack Clinically, she has not had any symptoms that would suggest a heart attack  She does not exercise much.  Has a herniated disk in her back   Not limited by CP or dyspnea Has numbness in her right leg if she stands or walks for long period.   Past Medical History:  Diagnosis Date  . Abnormal EKG   . Abnormal Pap smear 1981  . Apnea, sleep 01/06/11  . Diabetes mellitus   . Dyslipidemia   . Endometrial polyp 2009  . Fibroid   . H/O amenorrhea 2003  . H/O hemorrhoids   . H/O ulcerative colitis 1986  . HTN (hypertension)   . Hypertension   . Low back pain    bulging disc-cortisone injections  . Osteopenia   . PMB (postmenopausal bleeding) 2009  . SUI (stress urinary incontinence), female 2003  . Urinary incontinence 1998  . Yeast infection 1997    Past Surgical History:  Procedure Laterality Date  .  COMBINED HYSTEROSCOPY DIAGNOSTIC / D&C  2009   endometrial polyp and menopausal bleeding  . DILATATION & CURRETTAGE/HYSTEROSCOPY WITH RESECTOCOPE N/A 09/01/2012   Procedure: DILATATION & CURETTAGE/HYSTEROSCOPY WITH RESECTOCOPE;  Surgeon: Eldred Manges, MD;  Location: [facility];  Service: Gynecology;  Laterality: N/A;  . FOOT NEUROMA SURGERY Right 2000  . OVARIAN CYST REMOVAL  1979  . ovarian cystectomy Left 1995   dermoid    Current Medications: Current Meds  Medication Sig  . acetaminophen (TYLENOL) 500 MG tablet 500 mg every 6 hours for 3 days, then as needed. For mild to moderate pain  . atorvastatin (LIPITOR) 20 MG tablet Take 20 mg by mouth daily.  . Biotin 10000 MCG TABS Take 10,000 mcg by mouth daily.  . cetirizine (ZYRTEC) 10 MG tablet Take 10 mg by mouth daily.  . Coenzyme Q10 (COQ10) 100 MG CAPS Take 100 mg by mouth daily.  . fish oil-omega-3 fatty acids 1000 MG capsule Take 1 g by mouth daily.  . metFORMIN (GLUCOPHAGE-XR) 500 MG 24 hr tablet Take 1,000 mg by mouth daily with breakfast.   . Multiple Vitamin (MULTIVITAMIN WITH MINERALS) TABS Take 1 tablet by mouth daily.  . nebivolol (BYSTOLIC) 5 MG tablet Take 5 mg by mouth daily.  . ondansetron (ZOFRAN)  4 MG tablet Take 1 tablet (4 mg total) by mouth every 6 (six) hours.  . ONGLYZA 5 MG TABS tablet Take 5 mg by mouth daily.  . valsartan-hydrochlorothiazide (DIOVAN-HCT) 160-25 MG per tablet Take 1 tablet by mouth daily.     Allergies:   Asa [aspirin]; Beeswax; Lialda [mesalamine]; Sulfa antibiotics; and Sulfa drugs cross reactors   Social History   Socioeconomic History  . Marital status: Married    Spouse name: None  . Number of children: None  . Years of education: None  . Highest education level: None  Social Needs  . Financial resource strain: None  . Food insecurity - worry: None  . Food insecurity - inability: None  . Transportation needs - medical: None  . Transportation needs - non-medical: None    Occupational History  . None  Tobacco Use  . Smoking status: Never Smoker  . Smokeless tobacco: Never Used  Substance and Sexual Activity  . Alcohol use: Yes    Comment: rare  . Drug use: No  . Sexual activity: Yes    Birth control/protection: Post-menopausal  Other Topics Concern  . None  Social History Narrative  . None     Family History: The patient's family history includes Cancer in her maternal grandmother and mother; Diabetes in her maternal grandmother; Heart disease in her maternal uncle; Hypertension in her maternal uncle; Vision loss in her paternal uncle.  ROS:   Please see the history of present illness.     All other systems reviewed and are negative.  EKGs/Labs/Other Studies Reviewed:    The following studies were reviewed today:   EKG:  Feb. 5, 2019  NSR .   Poor R wave progression ( likely lead placement )  TWI in lateral leads ( unchanged from 2014)    Recent Labs: 07/25/2017: ALT 20; BUN 16; Creatinine, Ser 1.00; Hemoglobin 13.3; Platelets 233; Potassium 4.3; Sodium 137  Recent Lipid Panel No results found for: CHOL, TRIG, HDL, CHOLHDL, VLDL, LDLCALC, LDLDIRECT  Physical Exam:    VS:  BP 104/72   Pulse 72   Ht 5\' 6"  (1.676 m)   Wt 264 lb 12.8 oz (120.1 kg)   LMP 07/12/2012   SpO2 97%   BMI 42.74 kg/m     Wt Readings from Last 3 Encounters:  08/01/17 264 lb 12.8 oz (120.1 kg)  07/25/17 260 lb (117.9 kg)  09/23/16 255 lb 6.4 oz (115.8 kg)     GEN:  Middle age , moderately obese female  HEENT: Normal NECK: No JVD; No carotid bruits LYMPHATICS: No lymphadenopathy CARDIAC:  RR,   RESPIRATORY:  Clear to auscultation without rales, wheezing or rhonchi  ABDOMEN:   + BS, soft, moderately obese  MUSCULOSKELETAL:  No edema; No deformity  SKIN: Warm and dry NEUROLOGIC:  Alert and oriented x 3 PSYCHIATRIC:  Normal affect   ASSESSMENT:    No diagnosis found. PLAN:    In order of problems listed above:  1. Abn ECG :   Has TWI in the  lateral leads  - this is unchanged from her previous ECG in 2014 .  She had an extensive work up in 2014 including myoview and echo - both of which were normal .   At this point, I do not think she needs any further work up . I encouraged her to start a regular exercise program.  She likes to ride the recumbent bicycle.  She is able to progress in an exercise program then I  think that her cardiac situation is stable.  I have advised her to give [place] a call if she has any episodes of chest pain or shortness of breath.  She will call us as needed.    Medication Adjustments/Labs and Tests Ordered: Current medicines are reviewed at length with the patient today.  Concerns regarding medicines are outlined above.  No orders of the defined types were placed in this encounter.  No orders of the defined types were placed in this encounter.   Signed, Mertie Moores, MD  08/01/2017 2:57 PM    [facility]

## 2017-08-01 NOTE — Patient Instructions (Addendum)
Medication Instructions:  Your physician recommends that you continue on your current medications as directed. Please refer to the Current Medication list given to you today.   Labwork: None Ordered   Testing/Procedures: None Ordered   Follow-Up: Your physician recommends that you schedule a follow-up appointment in: as needed with Dr. Acie Fredrickson.    If you need a refill on your cardiac medications before your next appointment, please call your pharmacy.   Thank you for choosing [facility]! Christen Bame, RN [phone]               [facility] Low Glycemic Diet (Source: [facility], 2006) Low Glycemic Foods (20-49) (Decrease risk of developing heart disease) Breakfast Cereals: All-Bran All-Bran Fruit 'n Oats Fiber One Oatmeal (not instant) Oat bran Fruits and fruit juices: (Limit to 1-2 servings per day) Apples Apricots (fresh & dried) Blackberries Blueberries Cherries Cranberries Peaches Pears Plums Prunes Grapefruit Raspberries Strawberries Tangerine Apple juice Grapefruit juice Tomato juice Beans and legumes (fresh-cooked): Black-eyed peas Butter beans Chick peas Lentils  Green beans Lima beans Kidney beans Navy beans Pinto beans Snow peas Non-starchy vegetables: Asparagus, avocado, broccoli, cabbage, cauliflower, celery, cucumber, greens, lettuce, mushrooms, peppers, tomatoes, okra, onions, spinach, summer squash Grains: Barley Bulgur Rye Wild rice Nuts and oils : Almonds Peanuts Sunflower seeds Hazelnuts Pecans Walnuts Oils that are liquid at room temperature Dairy, fish, meat, soy, and eggs: Milk, skim Lowfat cheese Yogurt, lowfat, fruit sugar sweetened Lean red meat Fish  Skinless chicken & [place] Shellfish Egg whites (up to 3 daily) Soy products  Egg yolks (up to 7 or _____ per week) Moderate Glycemic Foods (50-69) Breakfast Cereals: Bran Buds Bran Chex Just Right Mini-Wheats  Special K Swiss  muesli Fruits: Banana (under-ripe) Dates Figs Grapes Kiwi Mango Oranges Raisins Fruit Juices: Cranberry juice Orange juice Beans and legumes: [place]-type baked beans Canned pinto, kidney, or navy beans Green peas Vegetables: Beets Carrots  Sweet potato Yam Corn on the cob Breads: Pita (pocket) bread Oat bran bread Pumpernickel bread Rye bread Wheat bread, high fiber  Grains: Cornmeal Rice, brown Rice, white Couscous Pasta: Macaroni Pizza, cheese Ravioli, meat filled Spaghetti, white  Nuts: Cashews Macadamia Snacks: Chocolate Ice cream, lowfat Muffin Popcorn High Glycemic Foods (70-100)  Breakfast Cereals: Cheerios Corn Chex Corn Flakes Cream of Wheat Grape Nuts Grape Nut Flakes Grits Nutri-Grain Puffed Rice Puffed Wheat Rice Chex Rice Krispies Shredded Wheat Team Total Fruits: Pineapple Watermelon Banana (over-ripe) Beverages: Sodas, sweet tea, pineapple juice Vegetables: Potato, baked, boiled, fried, mashed [place] fries Canned or frozen corn Parsnips Winter squash Breads: Most breads (white and whole grain) Bagels Bread sticks Bread stuffing Kaiser roll Dinner rolls Grains: Rice, instant Tapioca, with milk Candy and most cookies Snacks: Donuts Corn chips Jelly beans Pretzels Pastries

## 2017-08-24 DIAGNOSIS — E1121 Type 2 diabetes mellitus with diabetic nephropathy: Secondary | ICD-10-CM | POA: Diagnosis not present

## 2017-08-26 DIAGNOSIS — I10 Essential (primary) hypertension: Secondary | ICD-10-CM | POA: Diagnosis not present

## 2017-08-26 DIAGNOSIS — E1121 Type 2 diabetes mellitus with diabetic nephropathy: Secondary | ICD-10-CM | POA: Diagnosis not present

## 2017-08-26 DIAGNOSIS — Z0000 Encounter for general adult medical examination without abnormal findings: Secondary | ICD-10-CM | POA: Diagnosis not present

## 2018-01-04 DIAGNOSIS — G4733 Obstructive sleep apnea (adult) (pediatric): Secondary | ICD-10-CM | POA: Diagnosis not present

## 2018-02-27 DIAGNOSIS — E1121 Type 2 diabetes mellitus with diabetic nephropathy: Secondary | ICD-10-CM | POA: Diagnosis not present

## 2018-02-27 DIAGNOSIS — Z7984 Long term (current) use of oral hypoglycemic drugs: Secondary | ICD-10-CM | POA: Diagnosis not present

## 2018-02-27 DIAGNOSIS — Z23 Encounter for immunization: Secondary | ICD-10-CM | POA: Diagnosis not present

## 2018-02-27 DIAGNOSIS — I10 Essential (primary) hypertension: Secondary | ICD-10-CM | POA: Diagnosis not present

## 2018-04-12 DIAGNOSIS — G4733 Obstructive sleep apnea (adult) (pediatric): Secondary | ICD-10-CM | POA: Diagnosis not present

## 2018-04-19 DIAGNOSIS — H43813 Vitreous degeneration, bilateral: Secondary | ICD-10-CM | POA: Diagnosis not present

## 2018-04-19 DIAGNOSIS — E119 Type 2 diabetes mellitus without complications: Secondary | ICD-10-CM | POA: Diagnosis not present

## 2018-05-01 DIAGNOSIS — Z01419 Encounter for gynecological examination (general) (routine) without abnormal findings: Secondary | ICD-10-CM | POA: Diagnosis not present

## 2018-05-01 DIAGNOSIS — Z1231 Encounter for screening mammogram for malignant neoplasm of breast: Secondary | ICD-10-CM | POA: Diagnosis not present

## 2018-05-01 DIAGNOSIS — Z6841 Body Mass Index (BMI) 40.0 and over, adult: Secondary | ICD-10-CM | POA: Diagnosis not present

## 2018-09-01 DIAGNOSIS — Z0000 Encounter for general adult medical examination without abnormal findings: Secondary | ICD-10-CM | POA: Diagnosis not present

## 2018-09-01 DIAGNOSIS — I10 Essential (primary) hypertension: Secondary | ICD-10-CM | POA: Diagnosis not present

## 2018-09-01 DIAGNOSIS — E1121 Type 2 diabetes mellitus with diabetic nephropathy: Secondary | ICD-10-CM | POA: Diagnosis not present

## 2018-11-15 DIAGNOSIS — G4733 Obstructive sleep apnea (adult) (pediatric): Secondary | ICD-10-CM | POA: Diagnosis not present

## 2019-10-18 ENCOUNTER — Other Ambulatory Visit: Payer: Self-pay | Admitting: Physical Medicine and Rehabilitation

## 2019-10-18 DIAGNOSIS — M545 Low back pain, unspecified: Secondary | ICD-10-CM

## 2019-11-09 ENCOUNTER — Other Ambulatory Visit: Payer: Self-pay

## 2019-11-09 ENCOUNTER — Ambulatory Visit
Admission: RE | Admit: 2019-11-09 | Discharge: 2019-11-09 | Disposition: A | Discharge status: Home / Self Care | Payer: 59 | Source: Ambulatory Visit | Attending: Physical Medicine and Rehabilitation | Admitting: Physical Medicine and Rehabilitation

## 2019-11-09 DIAGNOSIS — M545 Low back pain, unspecified: Secondary | ICD-10-CM

## 2020-01-30 ENCOUNTER — Encounter (HOSPITAL_BASED_OUTPATIENT_CLINIC_OR_DEPARTMENT_OTHER): Payer: Self-pay | Admitting: *Deleted

## 2020-01-30 ENCOUNTER — Other Ambulatory Visit: Payer: Self-pay

## 2020-01-30 DIAGNOSIS — I10 Essential (primary) hypertension: Secondary | ICD-10-CM | POA: Diagnosis not present

## 2020-01-30 DIAGNOSIS — M79661 Pain in right lower leg: Secondary | ICD-10-CM | POA: Insufficient documentation

## 2020-01-30 DIAGNOSIS — Z79899 Other long term (current) drug therapy: Secondary | ICD-10-CM | POA: Insufficient documentation

## 2020-01-30 DIAGNOSIS — E119 Type 2 diabetes mellitus without complications: Secondary | ICD-10-CM | POA: Diagnosis not present

## 2020-01-30 DIAGNOSIS — Z7984 Long term (current) use of oral hypoglycemic drugs: Secondary | ICD-10-CM | POA: Diagnosis not present

## 2020-01-30 LAB — COMPREHENSIVE METABOLIC PANEL
ALT: 17 U/L (ref 0–44)
AST: 25 U/L (ref 15–41)
Albumin: 3.7 g/dL (ref 3.5–5.0)
Alkaline Phosphatase: 78 U/L (ref 38–126)
Anion gap: 12 (ref 5–15)
BUN: 16 mg/dL (ref 8–23)
CO2: 26 mmol/L (ref 22–32)
Calcium: 9.9 mg/dL (ref 8.9–10.3)
Chloride: 99 mmol/L (ref 98–111)
Creatinine, Ser: 1 mg/dL (ref 0.44–1.00)
GFR calc Af Amer: >60 mL/min (ref >60)
GFR calc non Af Amer: 60 mL/min — ABNORMAL LOW (ref >60)
Glucose, Bld: 307 mg/dL — ABNORMAL HIGH (ref 70–99)
Potassium: 3.9 mmol/L (ref 3.5–5.1)
Sodium: 137 mmol/L (ref 135–145)
Total Bilirubin: 0.3 mg/dL (ref 0.3–1.2)
Total Protein: 8.5 g/dL — ABNORMAL HIGH (ref 6.5–8.1)

## 2020-01-30 LAB — CBC WITH DIFFERENTIAL/PLATELET
Abs Immature Granulocytes: 0.06 10*3/uL (ref 0.00–0.07)
Basophils Absolute: 0 10*3/uL (ref 0.0–0.1)
Basophils Relative: 0 %
Eosinophils Absolute: 0 10*3/uL (ref 0.0–0.5)
Eosinophils Relative: 0 %
HCT: 39.9 % (ref 36.0–46.0)
Hemoglobin: 13.2 g/dL (ref 12.0–15.0)
Immature Granulocytes: 1 %
Lymphocytes Relative: 11 %
Lymphs Abs: 1.3 10*3/uL (ref 0.7–4.0)
MCH: 31.2 pg (ref 26.0–34.0)
MCHC: 33.1 g/dL (ref 30.0–36.0)
MCV: 94.3 fL (ref 80.0–100.0)
Monocytes Absolute: 0.4 10*3/uL (ref 0.1–1.0)
Monocytes Relative: 3 %
Neutro Abs: 10.2 10*3/uL — ABNORMAL HIGH (ref 1.7–7.7)
Neutrophils Relative %: 85 %
Platelets: 252 10*3/uL (ref 150–400)
RBC: 4.23 MIL/uL (ref 3.87–5.11)
RDW: 13.3 % (ref 11.5–15.5)
WBC: 11.9 10*3/uL — ABNORMAL HIGH (ref 4.0–10.5)
nRBC: 0 % (ref 0.0–0.2)

## 2020-01-30 LAB — CK: Total CK: 765 U/L — ABNORMAL HIGH (ref 38–234)

## 2020-01-30 LAB — MAGNESIUM: Magnesium: 1.8 mg/dL (ref 1.7–2.4)

## 2020-01-30 NOTE — ED Triage Notes (Addendum)
Pt c/o right calf pain x 3 days , sent by PMD for labs

## 2020-01-31 ENCOUNTER — Emergency Department (HOSPITAL_BASED_OUTPATIENT_CLINIC_OR_DEPARTMENT_OTHER)
Admission: RE | Admit: 2020-01-31 | Discharge: 2020-01-31 | Disposition: A | Discharge status: Home / Self Care | Payer: 59 | Source: Ambulatory Visit | Attending: Emergency Medicine | Admitting: Emergency Medicine

## 2020-01-31 ENCOUNTER — Emergency Department (HOSPITAL_BASED_OUTPATIENT_CLINIC_OR_DEPARTMENT_OTHER): Admit: 2020-01-31 | Payer: 59

## 2020-01-31 ENCOUNTER — Emergency Department (HOSPITAL_BASED_OUTPATIENT_CLINIC_OR_DEPARTMENT_OTHER)
Admission: EM | Admit: 2020-01-31 | Discharge: 2020-01-31 | Disposition: A | Discharge status: Home / Self Care | Payer: 59 | Attending: Emergency Medicine | Admitting: Emergency Medicine

## 2020-01-31 ENCOUNTER — Encounter (INDEPENDENT_AMBULATORY_CARE_PROVIDER_SITE_OTHER): Payer: Self-pay

## 2020-01-31 DIAGNOSIS — M79661 Pain in right lower leg: Secondary | ICD-10-CM

## 2020-01-31 LAB — D-DIMER, QUANTITATIVE: D-Dimer, Quant: 0.72 ug/mL-FEU — ABNORMAL HIGH (ref 0.00–0.50)

## 2020-01-31 MED ORDER — RIVAROXABAN 15 MG PO TABS
15.0000 mg | ORAL_TABLET | Freq: Once | ORAL | Status: AC
  Administered 2020-01-31: 15 mg via ORAL
  Filled 2020-01-31: qty 1

## 2020-01-31 NOTE — ED Provider Notes (Signed)
[facility] Provider Note: Georgena Spurling, MD, FACEP  CSN: [id number] MRN: [id number] ARRIVAL: 01/30/20 at 2026 ROOM: [place]  Leg Pain   HISTORY OF PRESENT ILLNESS  01/31/20 12:39 AM Amber Adams is a 64 y.o. female who has had pain in her right calf for the past 3 days.  She described the pain as severe and like a toothache.  It was worse with movement or palpation then.  Yesterday morning the pain was severe and was not relieved with pickle juice or yellow mustard.  She called her orthopedist who started her on a Medrol Dosepak and she is now pain-free.  A relative, who is in training to be an ED physician, recommended she come to the ED for some lab work and Doppler ultrasound.  She has had no chest pain or shortness of breath.  The affected calf is not swollen or erythematous.   Past Medical History:  Diagnosis Date  . Abnormal EKG   . Abnormal Pap smear 1981  . Apnea, sleep 01/06/11  . Diabetes mellitus   . Dyslipidemia   . Endometrial polyp 2009  . Fibroid   . H/O amenorrhea 2003  . H/O hemorrhoids   . H/O ulcerative colitis 1986  . HTN (hypertension)   . Hypertension   . Low back pain    bulging disc-cortisone injections  . Osteopenia   . PMB (postmenopausal bleeding) 2009  . SUI (stress urinary incontinence), female 2003  . Urinary incontinence 1998  . Yeast infection 1997    Past Surgical History:  Procedure Laterality Date  . COMBINED HYSTEROSCOPY DIAGNOSTIC / D&C  2009   endometrial polyp and menopausal bleeding  . DILATATION & CURRETTAGE/HYSTEROSCOPY WITH RESECTOCOPE N/A 09/01/2012   Procedure: DILATATION & CURETTAGE/HYSTEROSCOPY WITH RESECTOCOPE;  Surgeon: Eldred Manges, MD;  Location: [facility];  Service: Gynecology;  Laterality: N/A;  . FOOT NEUROMA SURGERY Right 2000  . OVARIAN CYST REMOVAL  1979  . ovarian cystectomy Left 1995   dermoid    Family History  Problem Relation Age of Onset  . Hypertension Maternal  Uncle   . Heart disease Maternal Uncle        MI  . Vision loss Paternal Uncle   . Diabetes Maternal Grandmother   . Cancer Maternal Grandmother        Ovarian  . Cancer Mother        leiomyosarcoma    Social History   Tobacco Use  . Smoking status: Never Smoker  . Smokeless tobacco: Never Used  Substance Use Topics  . Alcohol use: Yes    Comment: rare  . Drug use: No    Prior to Admission medications   Medication Sig Start Date End Date Taking? Authorizing Provider  acetaminophen (TYLENOL) 500 MG tablet 500 mg every 6 hours for 3 days, then as needed. For mild to moderate pain 09/01/12   Haygood, Seymour Bars, MD  atorvastatin (LIPITOR) 20 MG tablet Take 20 mg by mouth daily.    [provider]  Biotin 10000 MCG TABS Take 10,000 mcg by mouth daily.    [provider]  cetirizine (ZYRTEC) 10 MG tablet Take 10 mg by mouth daily.    [provider]  Coenzyme Q10 (COQ10) 100 MG CAPS Take 100 mg by mouth daily.    [provider]  fish oil-omega-3 fatty acids 1000 MG capsule Take 1 g by mouth daily.    [provider]  metFORMIN (GLUCOPHAGE-XR) 500  MG 24 hr tablet Take 1,000 mg by mouth daily with breakfast.     [provider]  Multiple Vitamin (MULTIVITAMIN WITH MINERALS) TABS Take 1 tablet by mouth daily.    [provider]  nebivolol (BYSTOLIC) 5 MG tablet Take 5 mg by mouth daily.    [provider]  ondansetron (ZOFRAN) 4 MG tablet Take 1 tablet (4 mg total) by mouth every 6 (six) hours. 07/25/17   Law, Alexandra M, PA-C  ONGLYZA 5 MG TABS tablet Take 5 mg by mouth daily. 07/29/17   [provider]  valsartan-hydrochlorothiazide (DIOVAN-HCT) 160-25 MG per tablet Take 1 tablet by mouth daily.    [provider]    Allergies Asa [aspirin], Beeswax, Lialda [mesalamine], Sulfa antibiotics, and Sulfa drugs cross reactors   REVIEW OF SYSTEMS  Negative except as noted here or in the History of  Present Illness.   PHYSICAL EXAMINATION  Initial Vital Signs Blood pressure 124/76, pulse 64, temperature 98.5 F (36.9 C), temperature source Oral, resp. rate 19, height 5\' 6"  (1.676 m), weight 112 kg, last menstrual period 07/12/2012, SpO2 100 %.  Examination General: Well-developed, well-nourished female in no acute distress; appearance consistent with age of record HENT: normocephalic; atraumatic Eyes: pupils equal, round and reactive to light; extraocular muscles intact Neck: supple Heart: regular rate and rhythm Lungs: clear to auscultation bilaterally Abdomen: soft; nondistended; nontender; bowel sounds present Extremities: No deformity; full range of motion; pulses normal; no calf tenderness; no edema Neurologic: Awake, alert and oriented; motor function intact in all extremities and symmetric; no facial droop Skin: Warm and dry Psychiatric: Normal mood and affect   RESULTS  Summary of this visit's results, reviewed and interpreted by myself:   EKG Interpretation  Date/Time:    Ventricular Rate:    PR Interval:    QRS Duration:   QT Interval:    QTC Calculation:   R Axis:     Text Interpretation:        Laboratory Studies: Results for orders placed or performed during the hospital encounter of 01/31/20 (from the past 24 hour(s))  CBC with Differential     Status: Abnormal   Collection Time: 01/30/20  8:54 PM  Result Value Ref Range   WBC 11.9 (H) 4.0 - 10.5 K/uL   RBC 4.23 3.87 - 5.11 MIL/uL   Hemoglobin 13.2 12.0 - 15.0 g/dL   HCT 39.9 36 - 46 %   MCV 94.3 80.0 - 100.0 fL   MCH 31.2 26.0 - 34.0 pg   MCHC 33.1 30.0 - 36.0 g/dL   RDW 13.3 11.5 - 15.5 %   Platelets 252 150 - 400 K/uL   nRBC 0.0 0.0 - 0.2 %   Neutrophils Relative % 85 %   Neutro Abs 10.2 (H) 1.7 - 7.7 K/uL   Lymphocytes Relative 11 %   Lymphs Abs 1.3 0.7 - 4.0 K/uL   Monocytes Relative 3 %   Monocytes Absolute 0.4 0 - 1 K/uL   Eosinophils Relative 0 %   Eosinophils Absolute 0.0 0 - 0  K/uL   Basophils Relative 0 %   Basophils Absolute 0.0 0 - 0 K/uL   Immature Granulocytes 1 %   Abs Immature Granulocytes 0.06 0.00 - 0.07 K/uL  Comprehensive metabolic panel     Status: Abnormal   Collection Time: 01/30/20  8:54 PM  Result Value Ref Range   Sodium 137 135 - 145 mmol/L   Potassium 3.9 3.5 - 5.1 mmol/L   Chloride  99 98 - 111 mmol/L   CO2 26 22 - 32 mmol/L   Glucose, Bld 307 (H) 70 - 99 mg/dL   BUN 16 8 - 23 mg/dL   Creatinine, Ser 1.00 0.44 - 1.00 mg/dL   Calcium 9.9 8.9 - 10.3 mg/dL   Total Protein 8.5 (H) 6.5 - 8.1 g/dL   Albumin 3.7 3.5 - 5.0 g/dL   AST 25 15 - 41 U/L   ALT 17 0 - 44 U/L   Alkaline Phosphatase 78 38 - 126 U/L   Total Bilirubin 0.3 0.3 - 1.2 mg/dL   GFR calc non Af Amer 60 (L) >60 mL/min   GFR calc Af Amer >60 >60 mL/min   Anion gap 12 5 - 15  Magnesium     Status: None   Collection Time: 01/30/20  8:54 PM  Result Value Ref Range   Magnesium 1.8 1.7 - 2.4 mg/dL  CK     Status: Abnormal   Collection Time: 01/30/20  8:54 PM  Result Value Ref Range   Total CK 765 (H) 38.0 - 234.0 U/L  D-dimer, quantitative (not at [facility])     Status: Abnormal   Collection Time: 01/30/20  8:54 PM  Result Value Ref Range   D-Dimer, Quant 0.72 (H) 0.00 - 0.50 ug/mL-FEU   Imaging Studies: No results found.  ED COURSE and MDM  Nursing notes, initial and subsequent vitals signs, including pulse oximetry, reviewed and interpreted by myself.  Vitals:   01/30/20 2046 01/30/20 2048 01/30/20 2340  BP:  130/83 124/76  Pulse:  79 64  Resp:  18 19  Temp:  98.5 F (36.9 C)   TempSrc:  Oral   SpO2:  98% 100%  Weight: 112 kg    Height: 5\' 6"  (1.676 m)     Medications  Rivaroxaban (XARELTO) tablet 15 mg (has no administration in time range)    1:23 AM The D-dimer is slightly above the age adjusted cut off.  We will give a dose of Xarelto and have her return for Doppler ultrasound later this morning.  PROCEDURES  Procedures   ED DIAGNOSES     ICD-10-CM     1. Right calf pain  M79.661        Shanon Rosser, MD 01/31/20 [phone]

## 2020-04-28 ENCOUNTER — Other Ambulatory Visit: Payer: Self-pay | Admitting: General Surgery

## 2020-06-28 ENCOUNTER — Other Ambulatory Visit: Payer: Self-pay

## 2020-06-28 ENCOUNTER — Other Ambulatory Visit: Payer: 59

## 2020-06-28 DIAGNOSIS — Z20822 Contact with and (suspected) exposure to covid-19: Secondary | ICD-10-CM

## 2020-07-01 LAB — SARS-COV-2, NAA 2 DAY TAT

## 2020-07-01 LAB — NOVEL CORONAVIRUS, NAA: SARS-CoV-2, NAA: NOT DETECTED

## 2021-02-06 ENCOUNTER — Other Ambulatory Visit: Payer: Self-pay | Admitting: Internal Medicine

## 2021-02-06 DIAGNOSIS — L723 Sebaceous cyst: Secondary | ICD-10-CM

## 2021-02-11 ENCOUNTER — Ambulatory Visit
Admission: RE | Admit: 2021-02-11 | Discharge: 2021-02-11 | Disposition: A | Discharge status: Home / Self Care | Payer: 59 | Source: Ambulatory Visit | Attending: Internal Medicine | Admitting: Internal Medicine

## 2021-02-11 ENCOUNTER — Other Ambulatory Visit: Payer: Self-pay

## 2021-02-11 DIAGNOSIS — L723 Sebaceous cyst: Secondary | ICD-10-CM

## 2021-04-07 ENCOUNTER — Ambulatory Visit
Admission: RE | Admit: 2021-04-07 | Discharge: 2021-04-07 | Disposition: A | Discharge status: Home / Self Care | Payer: Self-pay | Source: Ambulatory Visit | Attending: Internal Medicine | Admitting: Internal Medicine

## 2021-04-07 ENCOUNTER — Other Ambulatory Visit: Payer: Self-pay | Admitting: Internal Medicine

## 2021-04-07 DIAGNOSIS — M79601 Pain in right arm: Secondary | ICD-10-CM

## 2021-05-19 ENCOUNTER — Other Ambulatory Visit: Payer: Self-pay | Admitting: Orthopedic Surgery

## 2021-05-19 DIAGNOSIS — M25511 Pain in right shoulder: Secondary | ICD-10-CM

## 2021-06-16 ENCOUNTER — Other Ambulatory Visit: Payer: Self-pay

## 2021-06-16 ENCOUNTER — Ambulatory Visit
Admission: RE | Admit: 2021-06-16 | Discharge: 2021-06-16 | Disposition: A | Discharge status: Home / Self Care | Payer: PRIVATE HEALTH INSURANCE | Source: Ambulatory Visit | Attending: Orthopedic Surgery | Admitting: Orthopedic Surgery

## 2021-06-16 DIAGNOSIS — M25511 Pain in right shoulder: Secondary | ICD-10-CM

## 2021-11-03 IMAGING — CR DG SHOULDER 2+V*R*
3 series · 3 of 3 positions shown · non-contrast
Comparison: None.

CLINICAL DATA: Right shoulder pain

EXAM:
RIGHT SHOULDER - 2+ VIEW

[w shoulder ap internal righ]
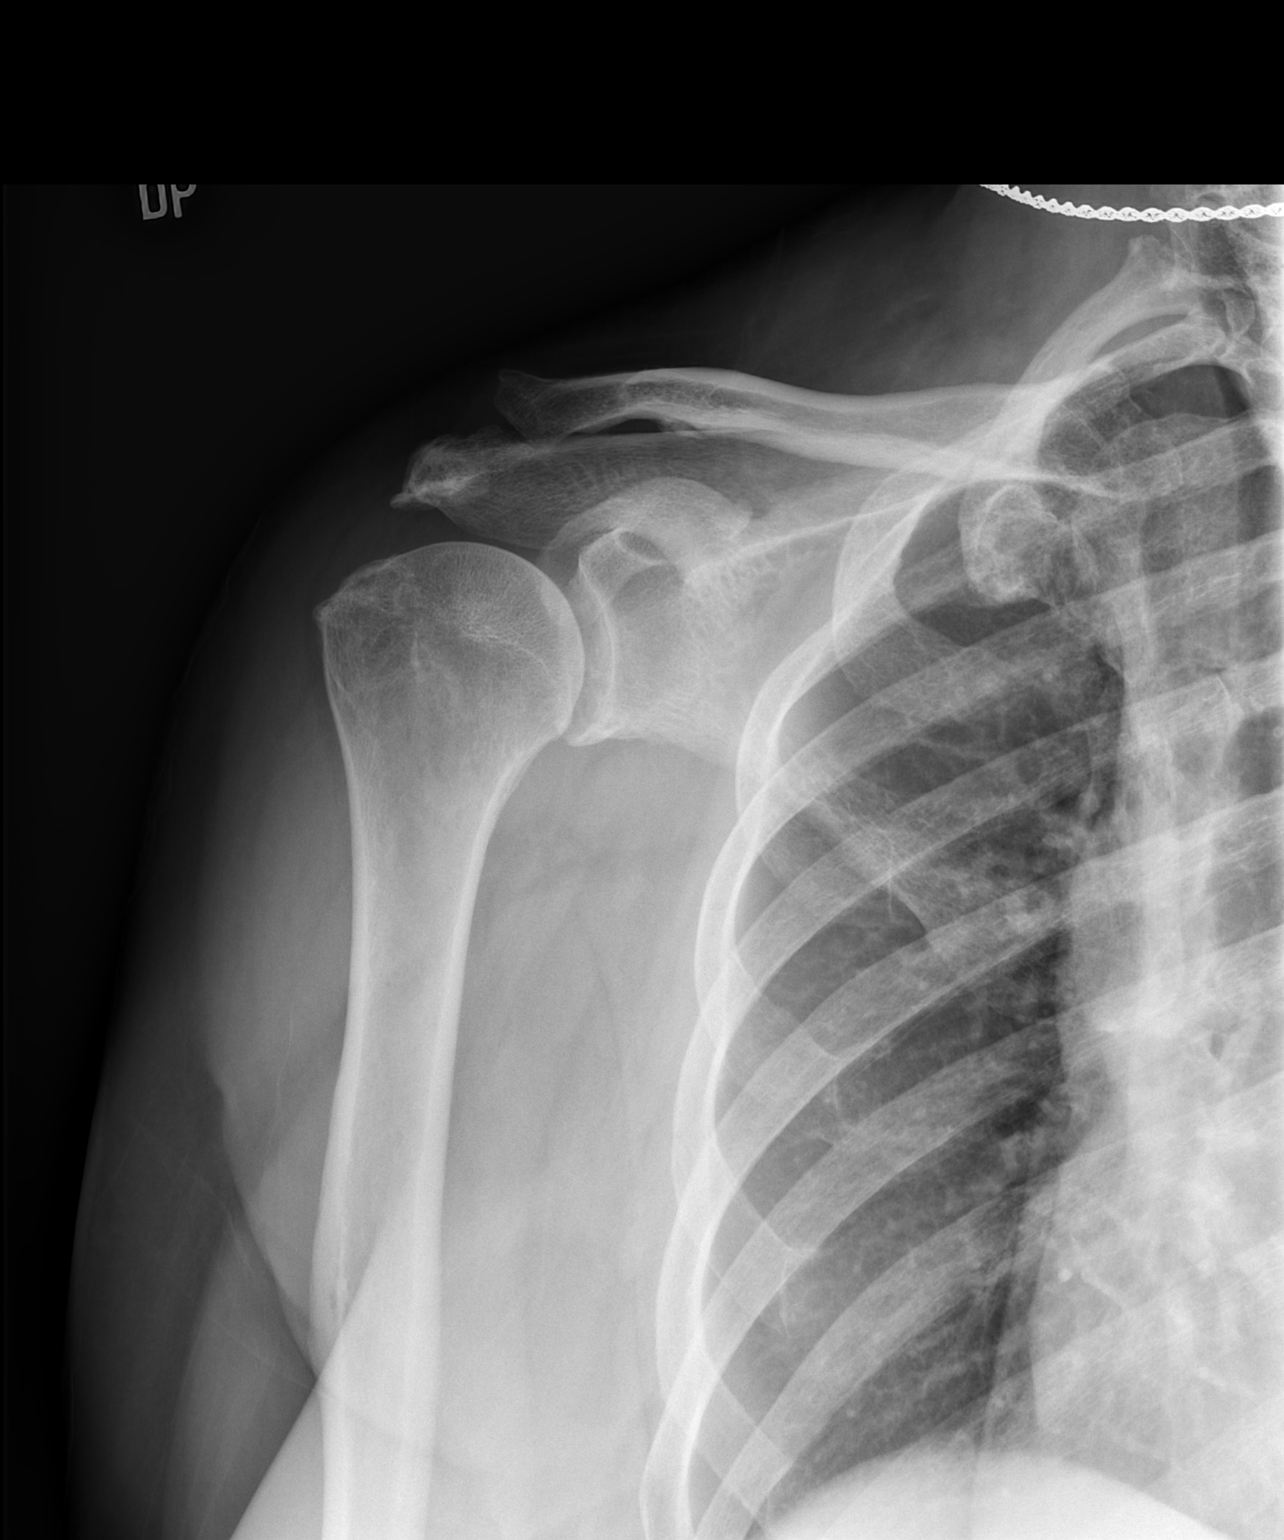

[w shoulder y view right]
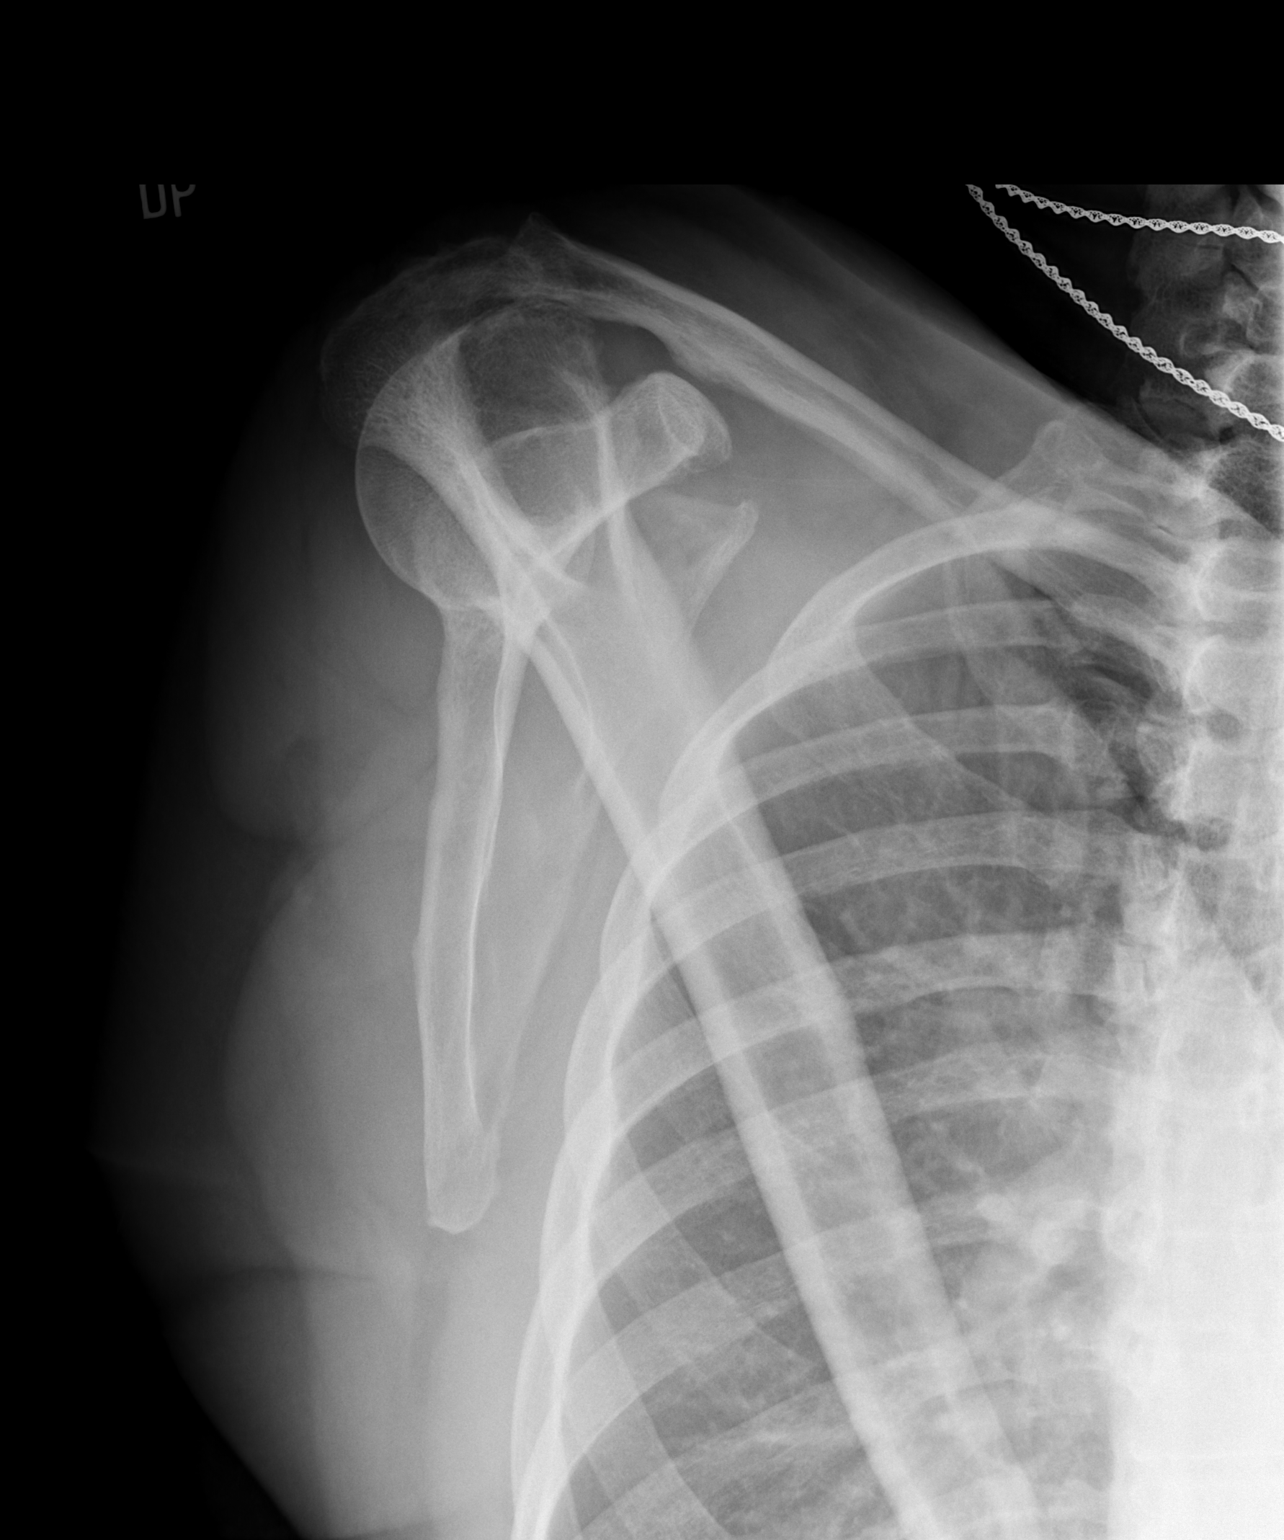

[w shoulder axillary right *]
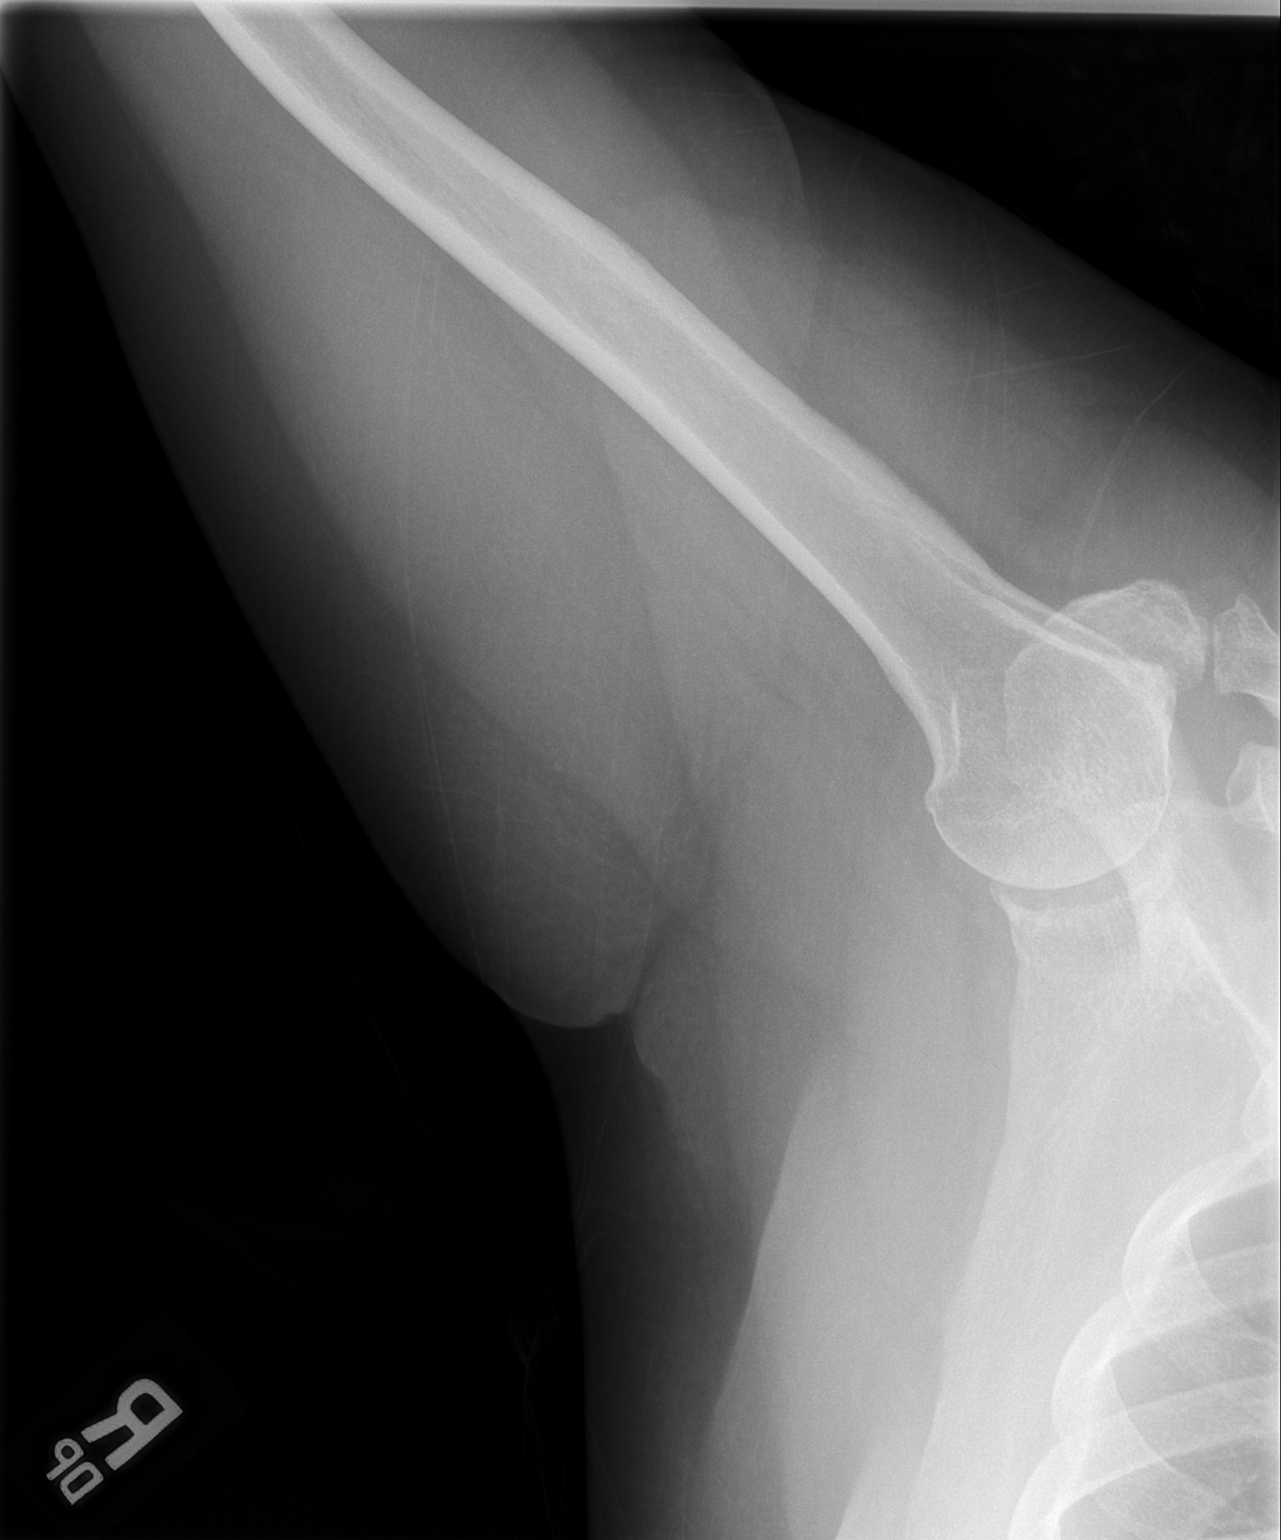

[3 of 3 positions shown; findings below may reference images not displayed]

FINDINGS: No fracture or malalignment. AC joint and glenohumeral degenerative
change. Mild inferior acromial spurring. Right apex is clear
IMPRESSION: Degenerative changes of the AC joint and glenohumeral joint.

## 2022-06-01 ENCOUNTER — Other Ambulatory Visit: Payer: Self-pay | Admitting: Internal Medicine

## 2022-06-01 DIAGNOSIS — E2839 Other primary ovarian failure: Secondary | ICD-10-CM

## 2022-11-18 ENCOUNTER — Other Ambulatory Visit: Payer: PRIVATE HEALTH INSURANCE

## 2024-10-10 ENCOUNTER — Ambulatory Visit: Payer: PRIVATE HEALTH INSURANCE | Admitting: Family Medicine
